# Patient Record
Sex: Female | Born: 1975 | Race: Black or African American | Hispanic: No | Marital: Single | Smoking: Never smoker
Health system: Southern US, Community
[De-identification: ages and names within clinical notes are randomized; demographics above are authoritative.]

## PROBLEM LIST (undated history)

## (undated) DIAGNOSIS — D259 Leiomyoma of uterus, unspecified: Secondary | ICD-10-CM

## (undated) DIAGNOSIS — R7303 Prediabetes: Secondary | ICD-10-CM

## (undated) DIAGNOSIS — G43909 Migraine, unspecified, not intractable, without status migrainosus: Secondary | ICD-10-CM

## (undated) DIAGNOSIS — E049 Nontoxic goiter, unspecified: Secondary | ICD-10-CM

## (undated) DIAGNOSIS — I10 Essential (primary) hypertension: Secondary | ICD-10-CM

## (undated) DIAGNOSIS — R87619 Unspecified abnormal cytological findings in specimens from cervix uteri: Secondary | ICD-10-CM

## (undated) DIAGNOSIS — E119 Type 2 diabetes mellitus without complications: Secondary | ICD-10-CM

## (undated) HISTORY — DX: Leiomyoma of uterus, unspecified: D25.9

## (undated) HISTORY — DX: Type 2 diabetes mellitus without complications: E11.9

## (undated) HISTORY — DX: Migraine, unspecified, not intractable, without status migrainosus: G43.909

## (undated) HISTORY — DX: Unspecified abnormal cytological findings in specimens from cervix uteri: R87.619

---

## 2003-06-16 HISTORY — PX: TUBAL LIGATION: SHX77

## 2006-09-27 ENCOUNTER — Emergency Department (HOSPITAL_COMMUNITY): Admission: EM | Admit: 2006-09-27 | Discharge: 2006-09-27 | Payer: Self-pay | Admitting: Emergency Medicine

## 2006-11-01 ENCOUNTER — Emergency Department (HOSPITAL_COMMUNITY): Admission: EM | Admit: 2006-11-01 | Discharge: 2006-11-01 | Payer: Self-pay | Admitting: Emergency Medicine

## 2006-11-03 ENCOUNTER — Emergency Department (HOSPITAL_COMMUNITY): Admission: EM | Admit: 2006-11-03 | Discharge: 2006-11-03 | Payer: Self-pay | Admitting: Emergency Medicine

## 2007-02-16 ENCOUNTER — Emergency Department (HOSPITAL_COMMUNITY): Admission: EM | Admit: 2007-02-16 | Discharge: 2007-02-16 | Payer: Self-pay | Admitting: Emergency Medicine

## 2007-11-25 ENCOUNTER — Emergency Department (HOSPITAL_COMMUNITY): Admission: EM | Admit: 2007-11-25 | Discharge: 2007-11-26 | Payer: Self-pay | Admitting: Emergency Medicine

## 2008-03-21 ENCOUNTER — Inpatient Hospital Stay (HOSPITAL_COMMUNITY): Admission: AD | Admit: 2008-03-21 | Discharge: 2008-03-21 | Payer: Self-pay | Admitting: Obstetrics and Gynecology

## 2008-12-22 HISTORY — PX: COLPOSCOPY: SHX161

## 2010-10-02 ENCOUNTER — Emergency Department (HOSPITAL_BASED_OUTPATIENT_CLINIC_OR_DEPARTMENT_OTHER)
Admission: EM | Admit: 2010-10-02 | Discharge: 2010-10-02 | Payer: Self-pay | Source: Home / Self Care | Admitting: Emergency Medicine

## 2011-07-12 LAB — URINALYSIS, ROUTINE W REFLEX MICROSCOPIC
Bilirubin Urine: NEGATIVE
Glucose, UA: NEGATIVE
Ketones, ur: 15 — AB
Leukocytes, UA: NEGATIVE
Protein, ur: 30 — AB
Urobilinogen, UA: 0.2

## 2011-07-12 LAB — DIFFERENTIAL
Basophils Relative: 0
Eosinophils Absolute: 0.4
Eosinophils Relative: 4
Monocytes Absolute: 0.7
Monocytes Relative: 7
Neutro Abs: 6.8
Neutrophils Relative %: 66

## 2011-07-12 LAB — CBC
HCT: 37.5
MCHC: 32.2
MCV: 83.8
RBC: 4.48
WBC: 10.7 — ABNORMAL HIGH

## 2011-07-12 LAB — WET PREP, GENITAL
Trich, Wet Prep: NONE SEEN
Yeast Wet Prep HPF POC: NONE SEEN

## 2011-07-12 LAB — POCT PREGNANCY, URINE
Operator id: 22373
Preg Test, Ur: NEGATIVE

## 2011-07-12 LAB — URINE MICROSCOPIC-ADD ON

## 2011-08-06 ENCOUNTER — Emergency Department (HOSPITAL_BASED_OUTPATIENT_CLINIC_OR_DEPARTMENT_OTHER)
Admission: EM | Admit: 2011-08-06 | Discharge: 2011-08-06 | Disposition: A | Discharge status: Home / Self Care | Payer: Managed Care, Other (non HMO) | Attending: Emergency Medicine | Admitting: Emergency Medicine

## 2011-08-06 ENCOUNTER — Encounter: Payer: Self-pay | Admitting: *Deleted

## 2011-08-06 DIAGNOSIS — N390 Urinary tract infection, site not specified: Secondary | ICD-10-CM

## 2011-08-06 DIAGNOSIS — Z79899 Other long term (current) drug therapy: Secondary | ICD-10-CM | POA: Insufficient documentation

## 2011-08-06 DIAGNOSIS — R109 Unspecified abdominal pain: Secondary | ICD-10-CM | POA: Insufficient documentation

## 2011-08-06 DIAGNOSIS — I10 Essential (primary) hypertension: Secondary | ICD-10-CM | POA: Insufficient documentation

## 2011-08-06 DIAGNOSIS — J45909 Unspecified asthma, uncomplicated: Secondary | ICD-10-CM | POA: Insufficient documentation

## 2011-08-06 HISTORY — DX: Essential (primary) hypertension: I10

## 2011-08-06 LAB — URINALYSIS, ROUTINE W REFLEX MICROSCOPIC
Bilirubin Urine: NEGATIVE
Glucose, UA: NEGATIVE mg/dL
Hgb urine dipstick: NEGATIVE
Ketones, ur: NEGATIVE mg/dL
Protein, ur: NEGATIVE mg/dL
Urobilinogen, UA: 1 mg/dL (ref 0.0–1.0)
pH: 7 (ref 5.0–8.0)

## 2011-08-06 LAB — URINE MICROSCOPIC-ADD ON

## 2011-08-06 MED ORDER — SULFAMETHOXAZOLE-TRIMETHOPRIM 800-160 MG PO TABS: 1.0000 | ORAL_TABLET | Freq: Two times a day (BID) | ORAL | Status: AC

## 2011-08-06 MED ORDER — TRAMADOL HCL 50 MG PO TABS: 50.0000 mg | ORAL_TABLET | Freq: Four times a day (QID) | ORAL | Status: AC | PRN

## 2011-08-06 NOTE — ED Provider Notes (Signed)
History     CSN: [id number] Arrival date & time: 08/06/2011  8:05 PM   First MD Initiated Contact with Patient 08/06/11 2120      Chief Complaint  Patient presents with  . Flank Pain    (Consider location/radiation/quality/duration/timing/severity/associated sxs/prior treatment) Patient is a 35 y.o. female presenting with flank pain. The history is provided by the patient.  Flank Pain The current episode started yesterday. The problem occurs constantly. The problem has been unchanged. Pertinent negatives include no abdominal pain, change in bowel habit or fever. Associated symptoms comments: She had nausea with vomiting one day prior to onset of right flank pain. No further nausea. . The symptoms are aggravated by twisting (Certain movements/positions.). She has tried nothing for the symptoms.    Past Medical History  Diagnosis Date  . Hypertension   . Asthma     Past Surgical History  Procedure Date  . Cesarean section     No family history on file.  History  Substance Use Topics  . Smoking status: Never Smoker   . Smokeless tobacco: Not on file  . Alcohol Use: Yes    OB History    Grav Para Term Preterm Abortions TAB SAB Ect Mult Living                  Review of Systems  Constitutional: Negative.  Negative for fever.  Respiratory: Negative.   Cardiovascular: Negative.   Gastrointestinal: Negative for abdominal pain and change in bowel habit.  Genitourinary: Positive for flank pain.  Musculoskeletal: Positive for back pain.    Allergies  Review of patient's allergies indicates no known allergies.  Home Medications   Current Outpatient Rx  Name Route Sig Dispense Refill  . ACETAMINOPHEN 500 MG PO TABS Oral Take 500 mg by mouth every 6 (six) hours as needed. For pain     . AZITHROMYCIN 250 MG PO TABS Oral Take 250 mg by mouth daily. Take 2 tabs on day 1 and take 1 tab on days 2-5     . FLUTICASONE PROPIONATE 50 MCG/ACT NA SUSP Nasal Place 2 sprays into  the nose daily.      Marland Kitchen FLUTICASONE-SALMETEROL 250-50 MCG/DOSE IN AEPB Inhalation Inhale 1 puff into the lungs every 12 (twelve) hours.      Marland Kitchen LISINOPRIL-HYDROCHLOROTHIAZIDE 20-12.5 MG PO TABS Oral Take 1 tablet by mouth daily.      . METHYLPREDNISOLONE 4 MG PO KIT Oral Take by mouth daily. follow package directions     . MONTELUKAST SODIUM 10 MG PO TABS Oral Take 10 mg by mouth daily.        BP 132/61  Pulse 70  Temp(Src) 98.3 F (36.8 C) (Oral)  Resp 15  Ht 5\' 6"  (1.676 m)  Wt 300 lb (136.079 kg)  BMI 48.42 kg/m2  SpO2 98%  LMP 07/16/2011  Physical Exam  Constitutional: She is oriented to person, place, and time. She appears well-developed and well-nourished.  Cardiovascular: Normal rate and regular rhythm.   Pulmonary/Chest: Effort normal and breath sounds normal.  Abdominal: Soft. There is no tenderness. There is no guarding.  Musculoskeletal:       Right flank nontender. Right lower back, paralumbar area (also pointed out as location of pain) is nontender to palpation.  Neurological: She is alert and oriented to person, place, and time. She has normal reflexes. Coordination normal.  Skin: Skin is warm and dry.    ED Course  Procedures (including critical care time)  Labs Reviewed  URINALYSIS, ROUTINE W REFLEX MICROSCOPIC - Abnormal; Notable for the following:    Appearance CLOUDY (*)    Leukocytes, UA LARGE (*)    All other components within normal limits  URINE MICROSCOPIC-ADD ON - Abnormal; Notable for the following:    Squamous Epithelial / LPF FEW (*)    Bacteria, UA MANY (*)    All other components within normal limits  PREGNANCY, URINE   No results found.   No diagnosis found.    MDM          Rodena Medin, PA 08/06/11 2215

## 2011-08-06 NOTE — ED Notes (Signed)
Pt c/o right flank pain that began yesterday. Pt denies urinary difficulty.

## 2011-08-07 NOTE — ED Provider Notes (Signed)
Medical screening examination/treatment/procedure(s) were performed by non-physician practitioner and as supervising physician I was immediately available for consultation/collaboration.   Zeven Kocak L Dejuan Elman, MD 08/07/11 0244 

## 2011-09-20 ENCOUNTER — Emergency Department (HOSPITAL_BASED_OUTPATIENT_CLINIC_OR_DEPARTMENT_OTHER)
Admission: EM | Admit: 2011-09-20 | Discharge: 2011-09-20 | Disposition: A | Discharge status: Home / Self Care | Payer: Managed Care, Other (non HMO) | Attending: Emergency Medicine | Admitting: Emergency Medicine

## 2011-09-20 ENCOUNTER — Encounter (HOSPITAL_BASED_OUTPATIENT_CLINIC_OR_DEPARTMENT_OTHER): Payer: Self-pay | Admitting: *Deleted

## 2011-09-20 DIAGNOSIS — J45909 Unspecified asthma, uncomplicated: Secondary | ICD-10-CM | POA: Insufficient documentation

## 2011-09-20 DIAGNOSIS — I10 Essential (primary) hypertension: Secondary | ICD-10-CM | POA: Insufficient documentation

## 2011-09-20 DIAGNOSIS — H11429 Conjunctival edema, unspecified eye: Secondary | ICD-10-CM | POA: Insufficient documentation

## 2011-09-20 MED ORDER — POLYMYXIN B-TRIMETHOPRIM 10000-0.1 UNIT/ML-% OP SOLN
2.0000 [drp] | OPHTHALMIC | Status: DC
  Administered 2011-09-20: 2 [drp] via OPHTHALMIC

## 2011-09-20 MED ORDER — KETOTIFEN FUMARATE 0.025 % OP SOLN: 1.0000 [drp] | Freq: Two times a day (BID) | OPHTHALMIC | Status: AC

## 2011-09-20 MED ORDER — BEPOTASTINE BESILATE 1.5 % OP SOLN: 1.0000 [drp] | Freq: Two times a day (BID) | OPHTHALMIC | Status: DC

## 2011-09-20 MED ORDER — POLYMYXIN B-TRIMETHOPRIM 10000-0.1 UNIT/ML-% OP SOLN
OPHTHALMIC | Status: AC
  Administered 2011-09-20: 2 [drp] via OPHTHALMIC
  Filled 2011-09-20: qty 10

## 2011-09-20 NOTE — ED Provider Notes (Signed)
History     CSN: [id number] Arrival date & time: 09/20/2011  9:07 PM   First MD Initiated Contact with Patient 09/20/11 2117      Chief Complaint  Patient presents with  . Eye Problem    (Consider location/radiation/quality/duration/timing/severity/associated sxs/prior treatment) HPI Patient complete as well he had a right. She states she has had nasal congestion and sinus pressure for the past several days and has been taking Ceftin for this x4 days. Tonight she noticed that the white portion of her eye was swollen. She denies any changes in her vision no pain in her eye. She has had some clear tear discharge from the eye but no thick discharge or crusting of her eyelids. She is had no redness or swelling of her eyelids. She's had no fever. She has had some purulent drainage from her nose over the past several days. She has had no systemic symptoms. She's had no history of anything similar to this in the past. No other associated systemic symptoms.  Past Medical History  Diagnosis Date  . Hypertension   . Asthma     Past Surgical History  Procedure Date  . Cesarean section     No family history on file.  History  Substance Use Topics  . Smoking status: Never Smoker   . Smokeless tobacco: Not on file  . Alcohol Use: Yes    OB History    Grav Para Term Preterm Abortions TAB SAB Ect Mult Living                  Review of Systems ROS reviewed and otherwise negative except for mentioned in HPI  Allergies  Review of patient's allergies indicates no known allergies.  Home Medications   Current Outpatient Rx  Name Route Sig Dispense Refill  . ALBUTEROL SULFATE HFA 108 (90 BASE) MCG/ACT IN AERS Inhalation Inhale 2 puffs into the lungs every 6 (six) hours as needed. For shortness of breath     . CLARITHROMYCIN 500 MG PO TABS Oral Take 500 mg by mouth 2 (two) times daily.      Marland Kitchen FLUTICASONE PROPIONATE 50 MCG/ACT NA SUSP Nasal Place 1 spray into the nose daily.     Marland Kitchen  FLUTICASONE-SALMETEROL 250-50 MCG/DOSE IN AEPB Inhalation Inhale 1 puff into the lungs every 12 (twelve) hours.      Marland Kitchen LISINOPRIL-HYDROCHLOROTHIAZIDE 20-12.5 MG PO TABS Oral Take 1 tablet by mouth daily.      Marland Kitchen MONTELUKAST SODIUM 10 MG PO TABS Oral Take 10 mg by mouth daily.      Marland Kitchen PROMETHAZINE-DM 6.25-15 MG/5ML PO SYRP Oral Take 10 mLs by mouth at bedtime as needed. For congestion     . PSEUDOEPHEDRINE HCL 30 MG PO TABS Oral Take 60 mg by mouth every 4 (four) hours as needed. For congestion     . TRAMADOL HCL 50 MG PO TABS Oral Take 50 mg by mouth every 6 (six) hours as needed. For pain. Maximum dose= 8 tablets per day     . BEPOTASTINE BESILATE 1.5 % OP SOLN Both Eyes Place 1 drop into both eyes 2 (two) times daily. 1 Bottle 0  . KETOTIFEN FUMARATE 0.025 % OP SOLN Right Eye Place 1 drop into the right eye 2 (two) times daily. 5 mL 0    BP 159/92  Pulse 94  Temp(Src) 98.8 F (37.1 C) (Oral)  Resp 20  SpO2 98% Vitals reviewed Physical Exam Physical Examination: General appearance - alert, well appearing, and in  no distress Mental status - alert, oriented to person, place, and time Eyes - pupils equal and reactive, extraocular eye movements intact, chemosis of inferior sclera, no eyelid swelling, no pain with EOM Ears - bilateral TM's and external ear canals normal Nose - normal and patent, erythema of nasal turbinates bilaterally Mouth - mucous membranes moist, pharynx normal without lesions Neck - supple, no significant adenopathy Chest - clear to auscultation, no wheezes, rales or rhonchi, symmetric air entry Heart - normal rate, regular rhythm, normal S1, S2, no murmurs, rubs, clicks or gallops Musculoskeletal - no joint tenderness, deformity or swelling Extremities - peripheral pulses normal, no pedal edema Skin - normal coloration and turgor, no rashes  ED Course  Procedures (including critical care time)  10:25 PM- slit lamp exam performed, normal with the exception of scleral  edema Paging opthalmology on call now.   10:41 PM d/w Dr. Delaney Meigs, opthalmology- he recommends starting polytrim gtts, also either zaditor or Bepreve drops for allergic conjunctivitis, cool compress, benadryl po, and he will see patient in the AM- to be at his office between 8-8:15am.    Labs Reviewed - No data to display No results found.   1. Chemosis of conjunctiva       MDM  Patient presents with onset of swelling of the sclera of her right eye, chemosis which began earlier tonight. She has no associated fever, changes in vision and no eye pain. Slit-lamp examination was normal with the exception of chemosis. Her case was discussed with ophthalmology who recommended the treatments as listed above. He will followup with the patient tomorrow morning. Patient was given Polytrim drops in the ED period she was discharged with strict return precautions and is agreeable with this plan.  Ethelda Chick, MD 09/20/11 [phone]

## 2011-09-20 NOTE — ED Notes (Signed)
Pt. Left eye is getting red and edema noted in the sclera

## 2011-09-20 NOTE — ED Notes (Signed)
Dr. Karma Ganja at bedside.  Pt. Is explaining that she has had a sinus infection with noted blowing her nose a lot but not this night.

## 2011-09-20 NOTE — ED Notes (Signed)
Slit lamp taken to Pt. Room and Dr. Karma Ganja to use at bedside.

## 2011-09-20 NOTE — ED Notes (Signed)
Pt. Is on meds for congestion and cough.  Pt. Is on Cefitan  And phenergan DM.  Pt. Was given these scripts by PCP on last Friday.

## 2011-09-20 NOTE — ED Notes (Signed)
Swelling to her right eye sclera. No pain.

## 2013-06-30 HISTORY — PX: ENDOMETRIAL BIOPSY: SHX622

## 2016-05-11 DIAGNOSIS — I10 Essential (primary) hypertension: Secondary | ICD-10-CM | POA: Diagnosis not present

## 2016-05-11 DIAGNOSIS — L509 Urticaria, unspecified: Secondary | ICD-10-CM | POA: Diagnosis not present

## 2016-05-11 DIAGNOSIS — T7840XA Allergy, unspecified, initial encounter: Secondary | ICD-10-CM | POA: Diagnosis present

## 2016-05-11 DIAGNOSIS — J45909 Unspecified asthma, uncomplicated: Secondary | ICD-10-CM | POA: Insufficient documentation

## 2016-05-11 DIAGNOSIS — Z79899 Other long term (current) drug therapy: Secondary | ICD-10-CM | POA: Insufficient documentation

## 2016-05-12 ENCOUNTER — Emergency Department (HOSPITAL_BASED_OUTPATIENT_CLINIC_OR_DEPARTMENT_OTHER)
Admission: EM | Admit: 2016-05-12 | Discharge: 2016-05-12 | Disposition: A | Discharge status: Home / Self Care | Payer: Managed Care, Other (non HMO) | Attending: Emergency Medicine | Admitting: Emergency Medicine

## 2016-05-12 ENCOUNTER — Encounter (HOSPITAL_BASED_OUTPATIENT_CLINIC_OR_DEPARTMENT_OTHER): Payer: Self-pay | Admitting: *Deleted

## 2016-05-12 DIAGNOSIS — T7840XA Allergy, unspecified, initial encounter: Secondary | ICD-10-CM

## 2016-05-12 DIAGNOSIS — L509 Urticaria, unspecified: Secondary | ICD-10-CM

## 2016-05-12 MED ORDER — EPINEPHRINE 0.3 MG/0.3ML IJ SOAJ: 0.3000 mg | Freq: Once | INTRAMUSCULAR | 0 refills | Status: AC | PRN

## 2016-05-12 MED ORDER — PREDNISONE 20 MG PO TABS
40.0000 mg | ORAL_TABLET | Freq: Once | ORAL | Status: AC
  Administered 2016-05-12: 40 mg via ORAL
  Filled 2016-05-12: qty 2

## 2016-05-12 MED ORDER — FAMOTIDINE 20 MG PO TABS
20.0000 mg | ORAL_TABLET | Freq: Once | ORAL | Status: AC
  Administered 2016-05-12: 20 mg via ORAL
  Filled 2016-05-12: qty 1

## 2016-05-12 MED ORDER — PREDNISONE 20 MG PO TABS: 60.0000 mg | ORAL_TABLET | Freq: Every day | ORAL | 0 refills | Status: DC

## 2016-05-12 MED ORDER — DIPHENHYDRAMINE HCL 25 MG PO CAPS
25.0000 mg | ORAL_CAPSULE | Freq: Once | ORAL | Status: AC
  Administered 2016-05-12: 25 mg via ORAL
  Filled 2016-05-12: qty 1

## 2016-05-12 NOTE — ED Provider Notes (Signed)
[place] [facility] Provider Note   CSN: [id number] Arrival date & time: 05/11/16  2333  First Provider Contact:  None       History   Chief Complaint Chief Complaint  Patient presents with  . Allergic Reaction    HPI Yvette Perry is a 40 y.o. female.  Patient with history of prediabetes, several months of frequent urticaria, presents with urticaria starting approximately 10 PM after eating at a restaurant. Patient denies any lip or tongue swelling, difficulty breathing, lightheadedness, syncope, nausea, vomiting, or diarrhea. Patient took 25mg  of Benadryl and applied hydrocortisone with some relief. She is uncertain as to what she is allergic to. She has a history of asthma which is controlled. No other allergic reactions or sensitivities. No new skin exposures. She was on Augmentin several weeks ago but tolerated this well. Symptoms gradually improving. Nothing makes symptoms worse.   The history is provided by the patient.  Allergic Reaction  Presenting symptoms: rash   Presenting symptoms: no difficulty swallowing and no wheezing     Past Medical History:  Diagnosis Date  . Asthma   . Hypertension     There are no active problems to display for this patient.   Past Surgical History:  Procedure Laterality Date  . CESAREAN SECTION      OB History    No data available       Home Medications    Prior to Admission medications   Medication Sig Start Date End Date Taking? Authorizing Provider  albuterol (PROVENTIL HFA;VENTOLIN HFA) 108 (90 BASE) MCG/ACT inhaler Inhale 2 puffs into the lungs every 6 (six) hours as needed. For shortness of breath     Historical Provider, MD  Bepotastine Besilate 1.5 % SOLN Place 1 drop into both eyes 2 (two) times daily. 09/20/11   Alfonzo Beers, MD  clarithromycin (BIAXIN) 500 MG tablet Take 500 mg by mouth 2 (two) times daily.      Historical Provider, MD  fluticasone (FLONASE) 50 MCG/ACT nasal spray Place 1 spray into the  nose daily.     Historical Provider, MD  Fluticasone-Salmeterol (ADVAIR) 250-50 MCG/DOSE AEPB Inhale 1 puff into the lungs every 12 (twelve) hours.      Historical Provider, MD  lisinopril-hydrochlorothiazide (PRINZIDE,ZESTORETIC) 20-12.5 MG per tablet Take 1 tablet by mouth daily.      Historical Provider, MD  montelukast (SINGULAIR) 10 MG tablet Take 10 mg by mouth daily.      Historical Provider, MD  promethazine-dextromethorphan (PROMETHAZINE-DM) 6.25-15 MG/5ML syrup Take 10 mLs by mouth at bedtime as needed. For congestion     Historical Provider, MD  pseudoephedrine (SUDAFED) 30 MG tablet Take 60 mg by mouth every 4 (four) hours as needed. For congestion     Historical Provider, MD  traMADol (ULTRAM) 50 MG tablet Take 50 mg by mouth every 6 (six) hours as needed. For pain. Maximum dose= 8 tablets per day     Historical Provider, MD    Family History No family history on file.  Social History Social History  Substance Use Topics  . Smoking status: Never Smoker  . Smokeless tobacco: Never Used  . Alcohol use Yes     Allergies   Review of patient's allergies indicates no known allergies.   Review of Systems Review of Systems  Constitutional: Negative for fever.  HENT: Negative for facial swelling and trouble swallowing.   Eyes: Negative for redness.  Respiratory: Negative for shortness of breath, wheezing and stridor.   Cardiovascular: Negative  for chest pain.  Gastrointestinal: Negative for nausea and vomiting.  Musculoskeletal: Negative for myalgias.  Skin: Positive for rash.  Neurological: Negative for light-headedness.  Psychiatric/Behavioral: Negative for confusion.     Physical Exam Updated Vital Signs BP 154/98 (BP Location: Left Arm)   Pulse 91   Temp 98.3 F (36.8 C) (Oral)   Resp 17   Ht 5\' 7"  (1.702 m)   Wt (!) 140.6 kg   LMP 04/17/2016   SpO2 98%   BMI 48.55 kg/m   Physical Exam  Constitutional: She appears well-developed and well-nourished.    HENT:  Head: Normocephalic and atraumatic.  Eyes: Conjunctivae are normal. Right eye exhibits no discharge. Left eye exhibits no discharge.  Neck: Normal range of motion. Neck supple.  Cardiovascular: Normal rate, regular rhythm and normal heart sounds.   Pulmonary/Chest: Effort normal and breath sounds normal.  Abdominal: Soft. There is no tenderness.  Neurological: She is alert.  Skin: Skin is warm and dry.  Scattered urticaria over her extremities, back, torso, face. Patient shows me a picture with more severe urticaria. Symptoms seem to be improving.  Psychiatric: She has a normal mood and affect.  Nursing note and vitals reviewed.    ED Treatments / Results  Labs (all labs ordered are listed, but only abnormal results are displayed) Labs Reviewed - No data to display  EKG  EKG Interpretation None       Radiology No results found.  Procedures Procedures (including critical care time)  Medications Ordered in ED Medications  predniSONE (DELTASONE) tablet 40 mg (not administered)  diphenhydrAMINE (BENADRYL) capsule 25 mg (not administered)  famotidine (PEPCID) tablet 20 mg (not administered)     Initial Impression / Assessment and Plan / ED Course  I have reviewed the triage vital signs and the nursing notes.  Pertinent labs & imaging results that were available during my care of the patient were reviewed by me and considered in my medical decision making (see chart for details).  Clinical Course  Comment By Time  Patient seen and examined. Work-up initiated. Medications ordered. No anaphylaxis.   Vital signs reviewed. Carlisle Cater, PA-C 07/29 0027   12:40 AM Handoff to Dr. Claudine Mouton. Plan: if stable at 1am, d/c to home with prednisone and antihistamines.   Final Clinical Impressions(s) / ED Diagnoses   Final diagnoses:  Urticaria   Pt with hives, no anaphylaxis. Likely food ingestion. Encouraged PCP f/u.   New Prescriptions New Prescriptions   No  medications on file     Carlisle Cater, PA-C 05/12/16 0041    Everlene Balls, MD 05/12/16 [phone]

## 2016-05-12 NOTE — ED Notes (Signed)
C/o whelps and itching after eating last pm,  Took 25 mg benadryl which has helped,  Denies throat swelling

## 2016-05-12 NOTE — ED Provider Notes (Signed)
Patient signed out as pending reassessment after mild allergic reaction. I reviewed the pictures on her cell phone from earlier tonight in her physical exam is vastly improved. Erythema on her arms is no longer present. She states the itching is resolved. We'll discharge her 4 more days of prednisone and also EpiPen to use as needed. She is advised to follow with her primary care physician so she can be referred to allergist. She demonstrates good understanding. She appears well and in no acute distress, vital signs remain within her normal limits and she is safe for discharge.   Everlene Balls, MD 05/12/16 0104

## 2016-05-12 NOTE — ED Triage Notes (Signed)
Pt. Has food allergies unknown and this has happened before.   Pt. Is not short of breath and has no tongue swelling or mouth swelling.

## 2018-09-07 ENCOUNTER — Inpatient Hospital Stay (HOSPITAL_COMMUNITY): Payer: Managed Care, Other (non HMO)

## 2018-09-07 ENCOUNTER — Encounter (HOSPITAL_COMMUNITY): Payer: Self-pay

## 2018-09-07 ENCOUNTER — Inpatient Hospital Stay (HOSPITAL_COMMUNITY)
Admission: AD | Admit: 2018-09-07 | Discharge: 2018-09-07 | Disposition: A | Discharge status: Home / Self Care | Payer: Managed Care, Other (non HMO) | Source: Ambulatory Visit | Attending: Obstetrics & Gynecology | Admitting: Obstetrics & Gynecology

## 2018-09-07 DIAGNOSIS — Y848 Other medical procedures as the cause of abnormal reaction of the patient, or of later complication, without mention of misadventure at the time of the procedure: Secondary | ICD-10-CM | POA: Insufficient documentation

## 2018-09-07 DIAGNOSIS — Z7952 Long term (current) use of systemic steroids: Secondary | ICD-10-CM | POA: Insufficient documentation

## 2018-09-07 DIAGNOSIS — I10 Essential (primary) hypertension: Secondary | ICD-10-CM | POA: Insufficient documentation

## 2018-09-07 DIAGNOSIS — J45909 Unspecified asthma, uncomplicated: Secondary | ICD-10-CM | POA: Diagnosis not present

## 2018-09-07 DIAGNOSIS — N939 Abnormal uterine and vaginal bleeding, unspecified: Secondary | ICD-10-CM

## 2018-09-07 DIAGNOSIS — Z79899 Other long term (current) drug therapy: Secondary | ICD-10-CM | POA: Diagnosis not present

## 2018-09-07 DIAGNOSIS — R102 Pelvic and perineal pain: Secondary | ICD-10-CM | POA: Diagnosis not present

## 2018-09-07 DIAGNOSIS — R7303 Prediabetes: Secondary | ICD-10-CM | POA: Diagnosis not present

## 2018-09-07 DIAGNOSIS — Z975 Presence of (intrauterine) contraceptive device: Secondary | ICD-10-CM

## 2018-09-07 DIAGNOSIS — T8332XA Displacement of intrauterine contraceptive device, initial encounter: Secondary | ICD-10-CM | POA: Diagnosis not present

## 2018-09-07 DIAGNOSIS — D259 Leiomyoma of uterus, unspecified: Secondary | ICD-10-CM | POA: Diagnosis not present

## 2018-09-07 HISTORY — DX: Prediabetes: R73.03

## 2018-09-07 HISTORY — DX: Nontoxic goiter, unspecified: E04.9

## 2018-09-07 LAB — URINALYSIS, ROUTINE W REFLEX MICROSCOPIC
BACTERIA UA: NONE SEEN
Bilirubin Urine: NEGATIVE
Glucose, UA: NEGATIVE mg/dL
KETONES UR: NEGATIVE mg/dL
Nitrite: NEGATIVE
Protein, ur: 30 mg/dL — AB
RBC / HPF: >50 RBC/hpf — ABNORMAL HIGH (ref 0–5)
Specific Gravity, Urine: 1.024 (ref 1.005–1.030)
pH: 5 (ref 5.0–8.0)

## 2018-09-07 LAB — CBC
HCT: 39.3 % (ref 36.0–46.0)
Hemoglobin: 12.2 g/dL (ref 12.0–15.0)
MCH: 26 pg (ref 26.0–34.0)
MCHC: 31 g/dL (ref 30.0–36.0)
MCV: 83.8 fL (ref 80.0–100.0)
NRBC: 0 % (ref 0.0–0.2)
Platelets: 377 10*3/uL (ref 150–400)
RBC: 4.69 MIL/uL (ref 3.87–5.11)
RDW: 15.1 % (ref 11.5–15.5)
WBC: 9.3 10*3/uL (ref 4.0–10.5)

## 2018-09-07 LAB — POCT PREGNANCY, URINE: PREG TEST UR: NEGATIVE

## 2018-09-07 MED ORDER — IBUPROFEN 800 MG PO TABS: 800.0000 mg | ORAL_TABLET | Freq: Three times a day (TID) | ORAL | 2 refills | Status: DC | PRN

## 2018-09-07 MED ORDER — MEGESTROL ACETATE 40 MG PO TABS
80.0000 mg | ORAL_TABLET | Freq: Once | ORAL | Status: AC
  Administered 2018-09-07: 80 mg via ORAL
  Filled 2018-09-07: qty 2

## 2018-09-07 MED ORDER — MEGESTROL ACETATE 40 MG PO TABS: 40.0000 mg | ORAL_TABLET | Freq: Two times a day (BID) | ORAL | 2 refills | Status: DC

## 2018-09-07 NOTE — MAU Provider Note (Signed)
Chief Complaint: Abdominal Pain and Vaginal Bleeding   First Provider Initiated Contact with Patient 09/07/18 1403     SUBJECTIVE HPI: Yvette Perry is a 42 y.o. G4P0 at  who presents to Maternity Admissions reporting moderate vaginal bleeding x 3 months w/ small clots. Has Hx of AUB abd known fibroids. Had transfusion 12/18 due to AUB. Mirena IUD placed soon afterwards. Controlled bleeding well until August 2019. Called Gyn about bleeding and her Gynecologist was no longer with the practice. Was not able to be seen for three weeks. Was Rx'd Aygestin 5 mg BID. Has been taking as directed x 7 days with no improvement.   Associated signs and symptoms: Pos for subjective tachycardia and mild dizziness--feels sort-of like when she had to have a transfusion last year.    Past Medical History:  Diagnosis Date  . Asthma   . Goiter   . Hypertension   . Pre-diabetes    OB History  Gravida Para Term Preterm AB Living  4            SAB TAB Ectopic Multiple Live Births        1      # Outcome Date GA Lbr Len/2nd Weight Sex Delivery Anes PTL Lv  4A Gravida      CS-LTranv     4B Gravida      CS-LTranv     3 Gravida      Vag-Spont     2 Gravida      Vag-Spont     1 Gravida      Vag-Spont      Past Surgical History:  Procedure Laterality Date  . CESAREAN SECTION     Social History   Socioeconomic History  . Marital status: Single    Spouse name: Not on file  . Number of children: Not on file  . Years of education: Not on file  . Highest education level: Not on file  Occupational History  . Not on file  Social Needs  . Financial resource strain: Not on file  . Food insecurity:    Worry: Not on file    Inability: Not on file  . Transportation needs:    Medical: Not on file    Non-medical: Not on file  Tobacco Use  . Smoking status: Never Smoker  . Smokeless tobacco: Never Used  Substance and Sexual Activity  . Alcohol use: Yes  . Drug use: No  . Sexual activity: Not Currently     Birth control/protection: IUD    Comment: tubal ligation  Lifestyle  . Physical activity:    Days per week: Not on file    Minutes per session: Not on file  . Stress: Not on file  Relationships  . Social connections:    Talks on phone: Not on file    Gets together: Not on file    Attends religious service: Not on file    Active member of club or organization: Not on file    Attends meetings of clubs or organizations: Not on file    Relationship status: Not on file  . Intimate partner violence:    Fear of current or ex partner: Not on file    Emotionally abused: Not on file    Physically abused: Not on file    Forced sexual activity: Not on file  Other Topics Concern  . Not on file  Social History Narrative  . Not on file   History reviewed. No pertinent family  history. No current facility-administered medications on file prior to encounter.    Current Outpatient Medications on File Prior to Encounter  Medication Sig Dispense Refill  . albuterol (PROVENTIL HFA;VENTOLIN HFA) 108 (90 BASE) MCG/ACT inhaler Inhale 2 puffs into the lungs every 6 (six) hours as needed. For shortness of breath     . Bepotastine Besilate 1.5 % SOLN Place 1 drop into both eyes 2 (two) times daily. 1 Bottle 0  . clarithromycin (BIAXIN) 500 MG tablet Take 500 mg by mouth 2 (two) times daily.      Marland Kitchen EPINEPHrine (EPIPEN 2-PAK) 0.3 mg/0.3 mL IJ SOAJ injection Inject 0.3 mLs (0.3 mg total) into the muscle once as needed (allergic reaction). 2 Device 0  . fluticasone (FLONASE) 50 MCG/ACT nasal spray Place 1 spray into the nose daily.     . Fluticasone-Salmeterol (ADVAIR) 250-50 MCG/DOSE AEPB Inhale 1 puff into the lungs every 12 (twelve) hours.      Marland Kitchen lisinopril-hydrochlorothiazide (PRINZIDE,ZESTORETIC) 20-12.5 MG per tablet Take 1 tablet by mouth daily.      . montelukast (SINGULAIR) 10 MG tablet Take 10 mg by mouth daily.      . predniSONE (DELTASONE) 20 MG tablet Take 3 tablets (60 mg total) by mouth  daily. 12 tablet 0  . promethazine-dextromethorphan (PROMETHAZINE-DM) 6.25-15 MG/5ML syrup Take 10 mLs by mouth at bedtime as needed. For congestion     . pseudoephedrine (SUDAFED) 30 MG tablet Take 60 mg by mouth every 4 (four) hours as needed. For congestion     . traMADol (ULTRAM) 50 MG tablet Take 50 mg by mouth every 6 (six) hours as needed. For pain. Maximum dose= 8 tablets per day      No Known Allergies  I have reviewed patient's Past Medical Hx, Surgical Hx, Family Hx, Social Hx, medications and allergies.   Review of Systems  Constitutional: Negative for chills and fever.  Cardiovascular:       Occasional sensation of tachycardia   Gastrointestinal: Positive for abdominal pain. Negative for constipation, diarrhea, nausea and vomiting.  Genitourinary: Positive for menstrual problem and vaginal bleeding. Negative for dysuria, flank pain, frequency, hematuria and vaginal discharge.  Neurological: Negative for dizziness.    OBJECTIVE Patient Vitals for the past 24 hrs:  BP Temp Temp src Pulse Resp SpO2 Weight  09/07/18 1757 (!) 142/75 - - 92 16 96 % -  09/07/18 1330 (!) 159/88 98.6 F (37 C) Oral 100 16 94 % (!) 139.3 kg   Constitutional: Well-developed, well-nourished, morbidly obese female in no acute distress. Skin: No Pallor  Cardiovascular: normal rate Respiratory: normal rate and effort.  GI: Abd soft, non-tender MS: Extremities nontender, no edema, normal ROM Neurologic: Alert and oriented x 4.  GU:  SPECULUM EXAM: NEFG, physiologic discharge, small amount of dark red blood noted, cervix clean, no polyps  BIMANUAL: cervix closed; UTA uterine size due to body habitus. Not obviously enlarged, no adnexal tenderness or masses. No CMT.  LAB RESULTS Results for orders placed or performed during the hospital encounter of 09/07/18 (from the past 24 hour(s))  CBC     Status: None   Collection Time: 09/07/18  1:46 PM  Result Value Ref Range   WBC 9.3 4.0 - 10.5 K/uL   RBC  4.69 3.87 - 5.11 MIL/uL   Hemoglobin 12.2 12.0 - 15.0 g/dL   HCT 39.3 36.0 - 46.0 %   MCV 83.8 80.0 - 100.0 fL   MCH 26.0 26.0 - 34.0 pg   MCHC 31.0  30.0 - 36.0 g/dL   RDW 15.1 11.5 - 15.5 %   Platelets 377 150 - 400 K/uL   nRBC 0.0 0.0 - 0.2 %  Urinalysis, Routine w reflex microscopic     Status: Abnormal   Collection Time: 09/07/18  1:46 PM  Result Value Ref Range   Color, Urine YELLOW YELLOW   APPearance HAZY (A) CLEAR   Specific Gravity, Urine 1.024 1.005 - 1.030   pH 5.0 5.0 - 8.0   Glucose, UA NEGATIVE NEGATIVE mg/dL   Hgb urine dipstick LARGE (A) NEGATIVE   Bilirubin Urine NEGATIVE NEGATIVE   Ketones, ur NEGATIVE NEGATIVE mg/dL   Protein, ur 30 (A) NEGATIVE mg/dL   Nitrite NEGATIVE NEGATIVE   Leukocytes, UA TRACE (A) NEGATIVE   RBC / HPF >50 (H) 0 - 5 RBC/hpf   Bacteria, UA NONE SEEN NONE SEEN   Squamous Epithelial / LPF 0-5 0 - 5   Mucus PRESENT   Pregnancy, urine POC     Status: None   Collection Time: 09/07/18  1:48 PM  Result Value Ref Range   Preg Test, Ur NEGATIVE NEGATIVE    IMAGING US Pelvis Transvanginal Non-ob (tv Only)  Result Date: 09/07/2018 CLINICAL DATA:  Lost IUD string.  Abnormal uterine bleeding.  Pain. EXAM: TRANSABDOMINAL AND TRANSVAGINAL ULTRASOUND OF PELVIS TECHNIQUE: Both transabdominal and transvaginal ultrasound examinations of the pelvis were performed. Transabdominal technique was performed for global imaging of the pelvis including uterus, ovaries, adnexal regions, and pelvic cul-de-sac. It was necessary to proceed with endovaginal exam following the transabdominal exam to visualize the endometrium and ovaries. COMPARISON:  None FINDINGS: Uterus Measurements: 9.1 x 6.1 x 6.5 cm.  A 13 mm fibroid is identified. Endometrium Thickness: 6 mm. No focal abnormality visualized. The patient's IUD is identified in the uterine fundus and body. I spoke directly to the sonographer to ensure that it was clearly visible on real-time imaging. Right ovary  Measurements: 4.5 x 2.9 x 3.6 cm = volume: 25 mL. Normal appearance/no adnexal mass. Left ovary Measurements: 2.4 x 1.6 x 1.9 cm = volume: 3.8 mL. Normal appearance/no adnexal mass. Other findings No abnormal free fluid. IMPRESSION: 1. The patient's IUD is identified. It is seen on provided images and was even more conspicuous on real-time imaging. 2. No cause for bleeding identified. The endometrial stripe thickness is normal for premenopausal woman. If bleeding remains unresponsive to hormonal or medical therapy, sonohysterogram should be considered for focal lesion work-up. (Ref: Radiological Reasoning: Algorithmic Workup of Abnormal Vaginal Bleeding with Endovaginal Sonography and Sonohysterography. AJR 2008; 962:X52-84) Electronically Signed   By: Dorise Bullion III M.D   On: 09/07/2018 17:14   US Pelvis (transabdominal Only)  Result Date: 09/07/2018 CLINICAL DATA:  Lost IUD string.  Abnormal uterine bleeding.  Pain. EXAM: TRANSABDOMINAL AND TRANSVAGINAL ULTRASOUND OF PELVIS TECHNIQUE: Both transabdominal and transvaginal ultrasound examinations of the pelvis were performed. Transabdominal technique was performed for global imaging of the pelvis including uterus, ovaries, adnexal regions, and pelvic cul-de-sac. It was necessary to proceed with endovaginal exam following the transabdominal exam to visualize the endometrium and ovaries. COMPARISON:  None FINDINGS: Uterus Measurements: 9.1 x 6.1 x 6.5 cm.  A 13 mm fibroid is identified. Endometrium Thickness: 6 mm. No focal abnormality visualized. The patient's IUD is identified in the uterine fundus and body. I spoke directly to the sonographer to ensure that it was clearly visible on real-time imaging. Right ovary Measurements: 4.5 x 2.9 x 3.6 cm = volume: 25 mL. Normal appearance/no  adnexal mass. Left ovary Measurements: 2.4 x 1.6 x 1.9 cm = volume: 3.8 mL. Normal appearance/no adnexal mass. Other findings No abnormal free fluid. IMPRESSION: 1. The  patient's IUD is identified. It is seen on provided images and was even more conspicuous on real-time imaging. 2. No cause for bleeding identified. The endometrial stripe thickness is normal for premenopausal woman. If bleeding remains unresponsive to hormonal or medical therapy, sonohysterogram should be considered for focal lesion work-up. (Ref: Radiological Reasoning: Algorithmic Workup of Abnormal Vaginal Bleeding with Endovaginal Sonography and Sonohysterography. AJR 2008; 539:J67-34) Electronically Signed   By: Dorise Bullion III M.D   On: 09/07/2018 17:14    MAU COURSE Orders Placed This Encounter  Procedures  . US PELVIS (TRANSABDOMINAL ONLY)  . US PELVIS TRANSVANGINAL NON-OB (TV ONLY)  . CBC  . Urinalysis, Routine w reflex microscopic  . Apply heat to affected area  . Pregnancy, urine POC  . Discharge patient   Meds ordered this encounter  Medications  . megestrol (MEGACE) tablet 80 mg  . megestrol (MEGACE) 40 MG tablet    Sig: Take 1 tablet (40 mg total) by mouth 2 (two) times daily. Take 1 tablet 3 times a day for 3 days then 1 tablet twice daily for 3 days then one tablet daily.  My increase to 2 tablets per day if bleeding increases.    Dispense:  60 tablet    Refill:  2    Order Specific Question:   Supervising Provider    Answer:   Elonda Husky, LUTHER H [[id number]]  . ibuprofen (ADVIL,MOTRIN) 800 MG tablet    Sig: Take 1 tablet (800 mg total) by mouth every 8 (eight) hours as needed for cramping.    Dispense:  30 tablet    Refill:  2    Order Specific Question:   Supervising Provider    Answer:   Florian Buff [[id number]]   Discussed Hx, labs, exam w/ Dr. Elonda Husky. Agrees w/ POC. New orders: D/C Aygestin and start Megace taper. May need Ablation.   MDM - AUB w/out anemia. Bleeding, VS stable. Mirena in correct position. Stable for OP Tx w/ Megace. Discussed possible need for ablation. If unable to get appt in a timely manner w/ current Gyn w/ F/U at [facility] practice. Lives in [place] so  offered Dr. Ihor Dow at [facility].   ASSESSMENT 1. IUD (intrauterine device) in place   2. Pelvic pain in female   3. Abnormal uterine bleeding (AUB)   4. IUD strings lost     PLAN Discharge home in stable condition. Bleeding precautions Follow-up Information    Dr Ouida Sills Follow up.   Why:  Call to schedule an appointment in the next 1 week to discuss long-term managament of your periods.        [facility] [place] Follow up.   Specialty:  Obstetrics and Gynecology Why:  Lavonia Drafts as needed for management of your abnormal bleeding Contact information: [address] [place] [address] [phone]         Allergies as of 09/07/2018   No Known Allergies     Medication List    TAKE these medications   albuterol 108 (90 Base) MCG/ACT inhaler Commonly known as:  PROVENTIL HFA;VENTOLIN HFA Inhale 2 puffs into the lungs every 6 (six) hours as needed. For shortness of breath   Bepotastine Besilate 1.5 % Soln Place 1 drop into both eyes 2 (two) times daily.  clarithromycin 500 MG tablet Commonly known as:  BIAXIN Take 500 mg by mouth 2 (two) times daily.   EPINEPHrine 0.3 mg/0.3 mL Soaj injection Commonly known as:  EPI-PEN Inject 0.3 mLs (0.3 mg total) into the muscle once as needed (allergic reaction).   fluticasone 50 MCG/ACT nasal spray Commonly known as:  FLONASE Place 1 spray into the nose daily.   Fluticasone-Salmeterol 250-50 MCG/DOSE Aepb Commonly known as:  ADVAIR Inhale 1 puff into the lungs every 12 (twelve) hours.   ibuprofen 800 MG tablet Commonly known as:  ADVIL,MOTRIN Take 1 tablet (800 mg total) by mouth every 8 (eight) hours as needed for cramping.   lisinopril-hydrochlorothiazide 20-12.5 MG tablet Commonly known as:  PRINZIDE,ZESTORETIC Take 1 tablet by mouth daily.   megestrol 40 MG tablet Commonly known as:  MEGACE Take 1 tablet (40 mg  total) by mouth 2 (two) times daily. Take 1 tablet 3 times a day for 3 days then 1 tablet twice daily for 3 days then one tablet daily.  My increase to 2 tablets per day if bleeding increases.   montelukast 10 MG tablet Commonly known as:  SINGULAIR Take 10 mg by mouth daily.   predniSONE 20 MG tablet Commonly known as:  DELTASONE Take 3 tablets (60 mg total) by mouth daily.   promethazine-dextromethorphan 6.25-15 MG/5ML syrup Commonly known as:  PROMETHAZINE-DM Take 10 mLs by mouth at bedtime as needed. For congestion   pseudoephedrine 30 MG tablet Commonly known as:  SUDAFED Take 60 mg by mouth every 4 (four) hours as needed. For congestion   traMADol 50 MG tablet Commonly known as:  ULTRAM Take 50 mg by mouth every 6 (six) hours as needed. For pain. Maximum dose= 8 tablets per day        Tamala Julian [place], [place] 09/07/2018  6:28 PM

## 2018-09-07 NOTE — Discharge Instructions (Signed)
Discussed management options for menorrhagia and fibroids including oral progesterones, Mirena IUD, endometrial ablation (Novasure/Hydrothermal Ablation/Thermachoice), myomectomy or hysterectomy as definitive surgical management. Discussed risks and benefits of each method.   Endometrial Ablation Endometrial ablation is a procedure that destroys the thin inner layer of the lining of the uterus (endometrium). This procedure may be done:  To stop heavy periods.  To stop bleeding that is causing anemia.  To control irregular bleeding.  To treat bleeding caused by small tumors (fibroids) in the endometrium.  This procedure is often an alternative to major surgery, such as removal of the uterus and cervix (hysterectomy). As a result of this procedure:  You may not be able to have children. However, if you are premenopausal (you have not gone through menopause): ? You may still have a small chance of getting pregnant. ? You will need to use a reliable method of birth control after the procedure to prevent pregnancy.  You may stop having a menstrual period, or you may have only a small amount of bleeding during your period. Menstruation may return several years after the procedure.  Tell a health care provider about:  Any allergies you have.  All medicines you are taking, including vitamins, herbs, eye drops, creams, and over-the-counter medicines.  Any problems you or family members have had with the use of anesthetic medicines.  Any blood disorders you have.  Any surgeries you have had.  Any medical conditions you have. What are the risks? Generally, this is a safe procedure. However, problems may occur, including:  A hole (perforation) in the uterus or bowel.  Infection of the uterus, bladder, or vagina.  Bleeding.  Damage to other structures or organs.  An air bubble in the lung (air embolus).  Problems with pregnancy after the procedure.  Failure of the  procedure.  Decreased ability to diagnose cancer in the endometrium.  What happens before the procedure?  You will have tests of your endometrium to make sure there are no pre-cancerous cells or cancer cells present.  You may have an ultrasound of the uterus.  You may be given medicines to thin the endometrium.  Ask your health care provider about: ? Changing or stopping your regular medicines. This is especially important if you take diabetes medicines or blood thinners. ? Taking medicines such as aspirin and ibuprofen. These medicines can thin your blood. Do not take these medicines before your procedure if your doctor tells you not to.  Plan to have someone take you home from the hospital or clinic. What happens during the procedure?  You will lie on an exam table with your feet and legs supported as in a pelvic exam.  To lower your risk of infection: ? Your health care team will wash or sanitize their hands and put on germ-free (sterile) gloves. ? Your genital area will be washed with soap.  An IV tube will be inserted into one of your veins.  You will be given a medicine to help you relax (sedative).  A surgical instrument with a light and camera (resectoscope) will be inserted into your vagina and moved into your uterus. This allows your surgeon to see inside your uterus.  Endometrial tissue will be removed using one of the following methods: ? Radiofrequency. This method uses a radiofrequency-alternating electric current to remove the endometrium. ? Cryotherapy. This method uses extreme cold to freeze the endometrium. ? Heated-free liquid. This method uses a heated saltwater (saline) solution to remove the endometrium. ? Microwave. This  method uses high-energy microwaves to heat up the endometrium and remove it. ? Thermal balloon. This method involves inserting a catheter with a balloon tip into the uterus. The balloon tip is filled with heated fluid to remove the  endometrium. The procedure may vary among health care providers and hospitals. What happens after the procedure?  Your blood pressure, heart rate, breathing rate, and blood oxygen level will be monitored until the medicines you were given have worn off.  As tissue healing occurs, you may notice vaginal bleeding for 4-6 weeks after the procedure. You may also experience: ? Cramps. ? Thin, watery vaginal discharge that is light pink or brown in color. ? A need to urinate more frequently than usual. ? Nausea.  Do not drive for 24 hours if you were given a sedative.  Do not have sex or insert anything into your vagina until your health care provider approves. Summary  Endometrial ablation is done to treat the many causes of heavy menstrual bleeding.  The procedure may be done only after medications have been tried to control the bleeding.  Plan to have someone take you home from the hospital or clinic. This information is not intended to replace advice given to you by your health care provider. Make sure you discuss any questions you have with your health care provider. Document Released: 08/10/2004 Document Revised: 10/18/2016 Document Reviewed: 10/18/2016 Elsevier Interactive Patient Education  2017 [employer].

## 2018-09-07 NOTE — MAU Note (Signed)
Got IUD last year. States she has been bleeding daily for a couple months  Was Rx medicine for the bleeding but it hasn't helped, states it slows the bleeding but doesn't stop it  Feels weak and tired  Saw clots this morning, wearing 2 pads at a time and has changed that 3 times  Feelings lots of abdominal pressure

## 2018-10-24 ENCOUNTER — Encounter: Payer: Self-pay | Admitting: Obstetrics & Gynecology

## 2018-10-24 ENCOUNTER — Ambulatory Visit (INDEPENDENT_AMBULATORY_CARE_PROVIDER_SITE_OTHER): Payer: Managed Care, Other (non HMO) | Admitting: Obstetrics & Gynecology

## 2018-10-24 VITALS — BP 130/77 | HR 91 | Ht 66.0 in | Wt 306.1 lb

## 2018-10-24 DIAGNOSIS — Z1151 Encounter for screening for human papillomavirus (HPV): Secondary | ICD-10-CM

## 2018-10-24 DIAGNOSIS — Z1239 Encounter for other screening for malignant neoplasm of breast: Secondary | ICD-10-CM

## 2018-10-24 DIAGNOSIS — N898 Other specified noninflammatory disorders of vagina: Secondary | ICD-10-CM | POA: Diagnosis not present

## 2018-10-24 DIAGNOSIS — N760 Acute vaginitis: Secondary | ICD-10-CM

## 2018-10-24 DIAGNOSIS — Z124 Encounter for screening for malignant neoplasm of cervix: Secondary | ICD-10-CM | POA: Diagnosis not present

## 2018-10-24 DIAGNOSIS — B9689 Other specified bacterial agents as the cause of diseases classified elsewhere: Secondary | ICD-10-CM | POA: Diagnosis not present

## 2018-10-24 DIAGNOSIS — Z01419 Encounter for gynecological examination (general) (routine) without abnormal findings: Secondary | ICD-10-CM

## 2018-10-24 DIAGNOSIS — N939 Abnormal uterine and vaginal bleeding, unspecified: Secondary | ICD-10-CM | POA: Diagnosis not present

## 2018-10-24 MED ORDER — MEGESTROL ACETATE 40 MG PO TABS: 40.0000 mg | ORAL_TABLET | Freq: Every day | ORAL | 3 refills | Status: DC

## 2018-10-24 NOTE — Progress Notes (Signed)
Subjective:     Yvette Perry is a 43 y.o. female here for a routine exam.  [id number] LMP 08/2018 She had 2 months of bleeding before that.  Current complaints: h/o AUB s/p hospitalization last year and had 3 units of blood. She was seen my Dr. Aurea Graff who placed a LnIUD 10/14/2017. She reports cont'd bleeding despite he LnIUD. She has been on Norethindrone with no relief of sx. Pt was seen at [facility] 09/07/2018 and was placed on Megace. Her bleeding improved with the Megace.  Pt had several endometrial bx and all were neg. Pt was last seen by that doc in 12/2017.    Gynecologic History No LMP recorded. (Menstrual status: IUD). Contraception: LnIUD / BTL Last Pap: 10/2017. Results were: normal Last mammogram: never had.   Obstetric History OB History  Gravida Para Term Preterm AB Living  4 4 3 1   4   SAB TAB Ectopic Multiple Live Births        1 4    # Outcome Date GA Lbr Len/2nd Weight Sex Delivery Anes PTL Lv  4A Preterm 07/12/03 [redacted]w[redacted]d   M CS-LTranv Spinal  LIV  4B Preterm 07/12/03 [redacted]w[redacted]d   M CS-LTranv Spinal    3 Term 2002 [redacted]w[redacted]d   F Vag-Spont None  LIV  2 Term 1998 [redacted]w[redacted]d   M Vag-Spont   LIV  1 Term 5 [redacted]w[redacted]d   F Vag-Spont   LIV   The following portions of the patient's history were reviewed and updated as appropriate: allergies, current medications, past family history, past medical history, past social history, past surgical history and problem list.  Review of Systems Pertinent items are noted in HPI.    Objective:  BP 130/77   Pulse 91   Ht 5\' 6"  (1.676 m)   Wt (!) 306 lb 1.9 oz (138.9 kg)   BMI 49.41 kg/m  General Appearance:    Alert, cooperative, no distress, appears stated age  Head:    Normocephalic, without obvious abnormality, atraumatic  Eyes:    conjunctiva/corneas clear, EOM's intact, both eyes  Ears:    Normal external ear canals, both ears  Nose:   Nares normal, septum midline, mucosa normal, no drainage    or sinus tenderness  Throat:   Lips, mucosa, and  tongue normal; teeth and gums normal  Neck:   Supple, symmetrical, trachea midline, no adenopathy;    thyroid:  no enlargement/tenderness/nodules  Back:     Symmetric, no curvature, ROM normal, no CVA tenderness  Lungs:     Clear to auscultation bilaterally, respirations unlabored  Chest Wall:    No tenderness or deformity   Heart:    Regular rate and rhythm, S1 and S2 normal, no murmur, rub   or gallop  Breast Exam:    No tenderness, masses, or nipple abnormality  Abdomen:     Soft, non-tender, bowel sounds active all four quadrants,    no masses, no organomegaly; obese  Genitalia:    Normal female without lesion, discharge or tenderness; no cervical lesions; uterus mobile      Extremities:   Extremities normal, atraumatic, no cyanosis or edema  Pulses:   2+ and symmetric all extremities  Skin:   Skin color, texture, turgor normal, no rashes or lesions     09/07/2018 CLINICAL DATA:  Lost IUD string.  Abnormal uterine bleeding.  Pain.  EXAM: TRANSABDOMINAL AND TRANSVAGINAL ULTRASOUND OF PELVIS  TECHNIQUE: Both transabdominal and transvaginal ultrasound examinations of the pelvis were  performed. Transabdominal technique was performed for global imaging of the pelvis including uterus, ovaries, adnexal regions, and pelvic cul-de-sac. It was necessary to proceed with endovaginal exam following the transabdominal exam to visualize the endometrium and ovaries.  COMPARISON:  None  FINDINGS: Uterus  Measurements: 9.1 x 6.1 x 6.5 cm.  A 13 mm fibroid is identified.  Endometrium  Thickness: 6 mm. No focal abnormality visualized. The patient's IUD is identified in the uterine fundus and body. I spoke directly to the sonographer to ensure that it was clearly visible on real-time imaging.  Right ovary  Measurements: 4.5 x 2.9 x 3.6 cm = volume: 25 mL. Normal appearance/no adnexal mass.  Left ovary  Measurements: 2.4 x 1.6 x 1.9 cm = volume: 3.8 mL.  Normal appearance/no adnexal mass.  Other findings  No abnormal free fluid.  IMPRESSION: 1. The patient's IUD is identified. It is seen on provided images and was even more conspicuous on real-time imaging. 2. No cause for bleeding identified. The endometrial stripe thickness is normal for premenopausal woman. If bleeding remains unresponsive to hormonal or medical therapy, sonohysterogram should be considered for focal lesion work-up. (Ref: Radiological Reasoning: Algorithmic Workup of Abnormal Vaginal Bleeding with Endovaginal Sonography and Sonohysterography. AJR 2008; [id number]:N82-95)  Assessment:    Healthy female exam.   AUB improved with Megace- per pt has had endo bx within the year. Need records  Breast cancer screen   Plan:    Mammogram ordered.    F/u PAP Megace 40mg  daily  Records from prev GYN ofc.  Walking daily 6 days/ week  Delisha Peaden L. Harraway-Smith, M.D., Cherlynn June

## 2018-10-24 NOTE — Patient Instructions (Signed)
WALKING  Walking is a great form of exercise to increase your strength, endurance and overall fitness.  A walking program can help you start slowly and gradually build endurance as you go.  Everyone's ability is different, so each person's starting point will be different.  You do not have to follow them exactly.  The are just samples. You should simply find out what's right for you and stick to that program.   In the beginning, you'll start off walking 2-3 times a day for short distances.  As you get stronger, you'll be walking further at just 1-2 times per day.  A. You Can Walk For A Certain Length Of Time Each Day    Walk 5 minutes 3 times per day.  Increase 2 minutes every 2 days (3 times per day).  Work up to 25-30 minutes (1-2 times per day).   Example:   Day 1-2 5 minutes 3 times per day   Day 7-8 12 minutes 2-3 times per day   Day 13-14 25 minutes 1-2 times per day  B. You Can Walk For a Certain Distance Each Day     Distance can be substituted for time.    Example:   3 trips to mailbox (at road)   3 trips to corner of block   3 trips around the block  C. Go to local [employer] and use the track.    Walk for distance 1/2 mile to start around track  Or time 30 minutes  D. Increase to 45 min - 1 hour daily!

## 2018-10-25 ENCOUNTER — Ambulatory Visit (HOSPITAL_BASED_OUTPATIENT_CLINIC_OR_DEPARTMENT_OTHER): Payer: Managed Care, Other (non HMO)

## 2018-10-28 LAB — CYTOLOGY - PAP
Bacterial vaginitis: POSITIVE — AB
Candida vaginitis: NEGATIVE
DIAGNOSIS: NEGATIVE
HPV: NOT DETECTED

## 2018-10-30 ENCOUNTER — Telehealth: Payer: Self-pay

## 2018-10-30 ENCOUNTER — Other Ambulatory Visit: Payer: Self-pay | Admitting: Obstetrics & Gynecology

## 2018-10-30 MED ORDER — METRONIDAZOLE 500 MG PO TABS: 500.0000 mg | ORAL_TABLET | Freq: Two times a day (BID) | ORAL | 0 refills | Status: DC

## 2018-10-30 NOTE — Telephone Encounter (Signed)
Called patient and unable to leave message. Kathrene Alu RN

## 2018-10-30 NOTE — Telephone Encounter (Signed)
-----   Message from Lavonia Drafts, MD sent at 10/30/2018 11:49 AM EST ----- Please call pt. She has BV. Rx sent to pharmacy.  Thx, clh-S

## 2018-10-30 NOTE — Addendum Note (Signed)
Addended by: Lavonia Drafts on: 10/30/2018 11:49 AM   Modules accepted: Orders

## 2018-10-31 NOTE — Telephone Encounter (Signed)
Called again and unable to leave message. Kathrene Alu RN

## 2019-06-08 IMAGING — US US TRANSVAGINAL NON-OB
2 series · 15 of 25 positions shown · non-contrast
Comparison: None

CLINICAL DATA: Lost IUD string.  Abnormal uterine bleeding.  Pain.

EXAM:
TRANSABDOMINAL AND TRANSVAGINAL ULTRASOUND OF PELVIS
TECHNIQUE: Both transabdominal and transvaginal ultrasound examinations of the
pelvis were performed. Transabdominal technique was performed for
global imaging of the pelvis including uterus, ovaries, adnexal
regions, and pelvic cul-de-sac. It was necessary to proceed with
endovaginal exam following the transabdominal exam to visualize the
endometrium and ovaries.

[Series 1: us transvaginal non-ob · 7 of 37 slices shown (1 of 2)]
[im 1/37]
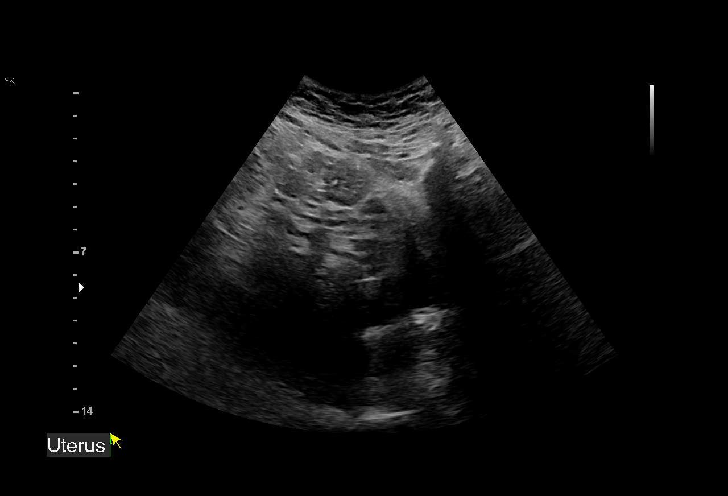
[im 7/37]
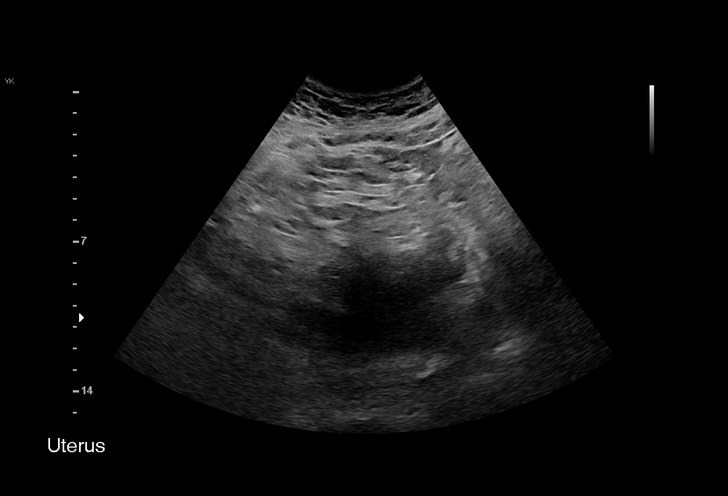
[im 14/37]
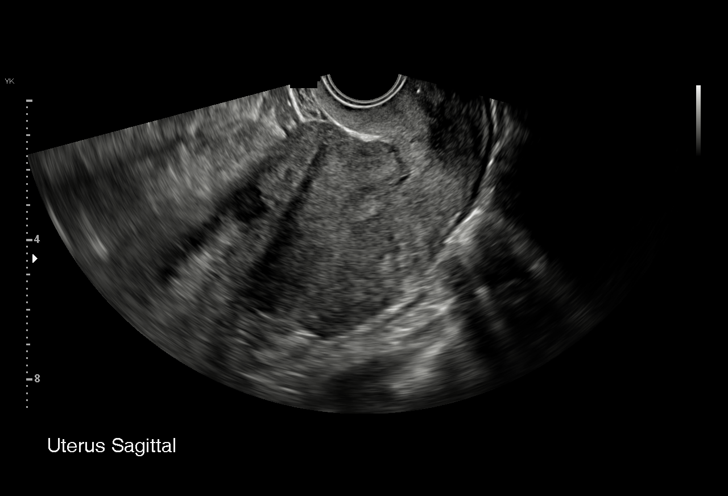
[im 17/37]
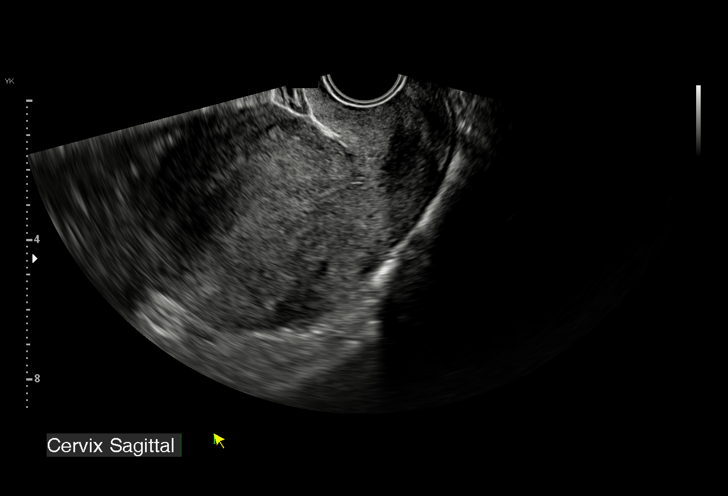
[im 23/37]
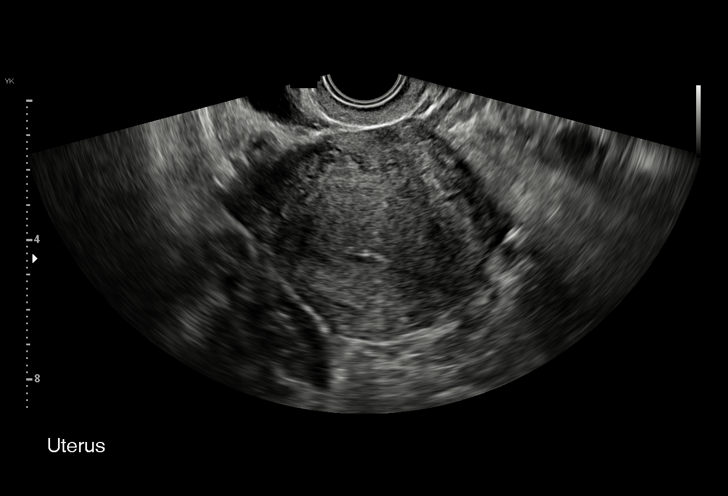
[im 30/37]
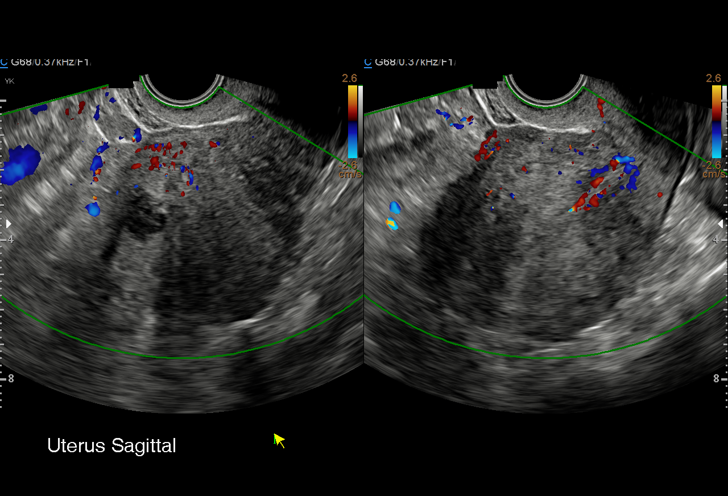
[im 33/37]
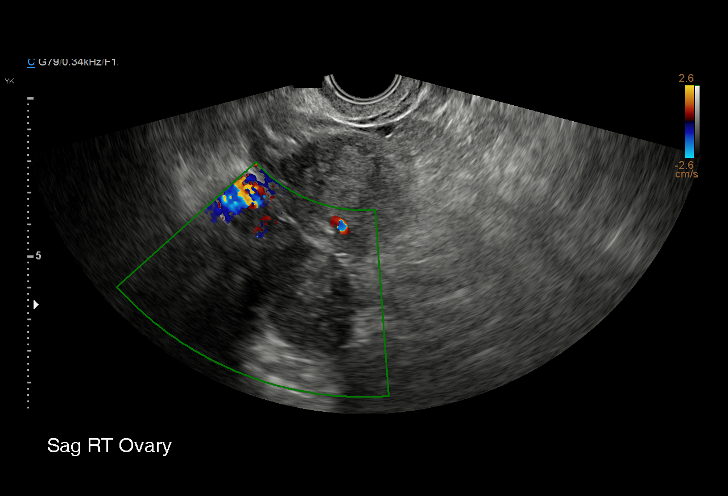

[Series 3: us transvaginal non-ob · 38 acquisitions, 8 frames shown (2 of 2)]
[im 1/38]
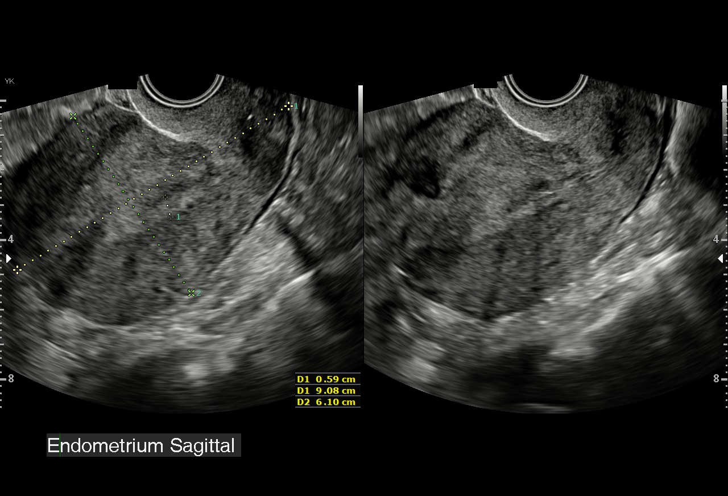
[im 7/38]
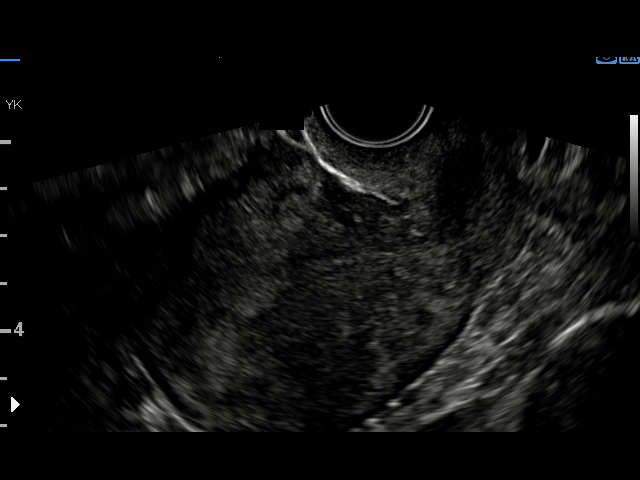
[im 10/38]
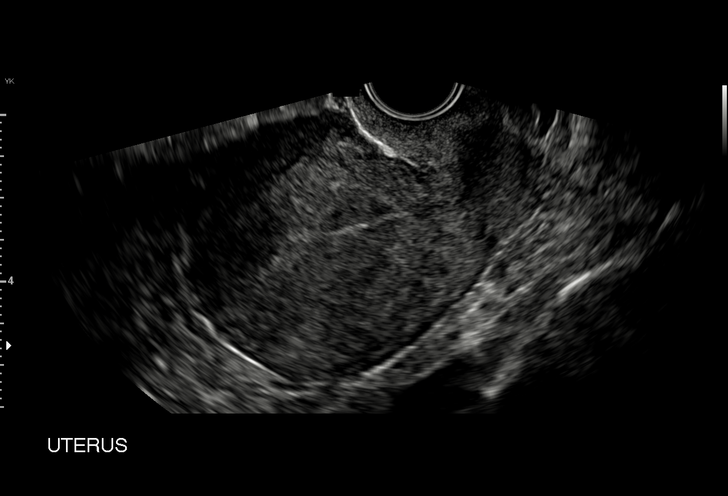
[im 16/38]
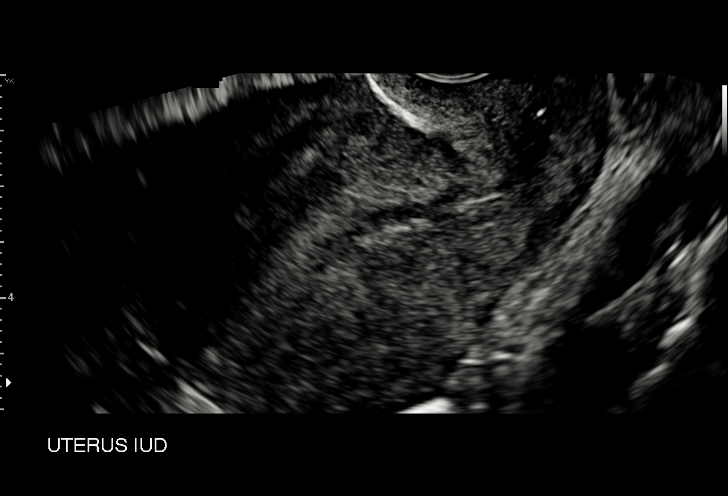
[im 22/38]
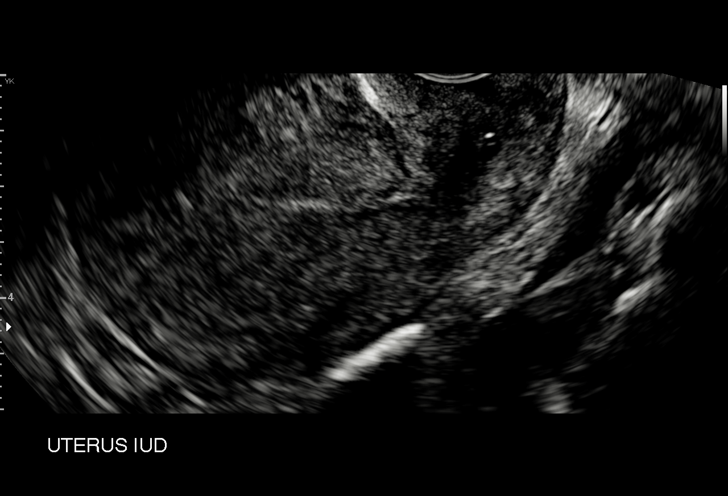
[im 25/38]
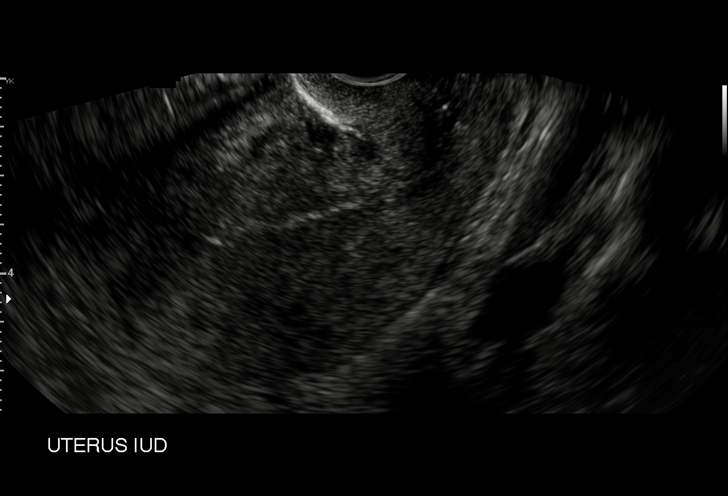
[im 31/38]
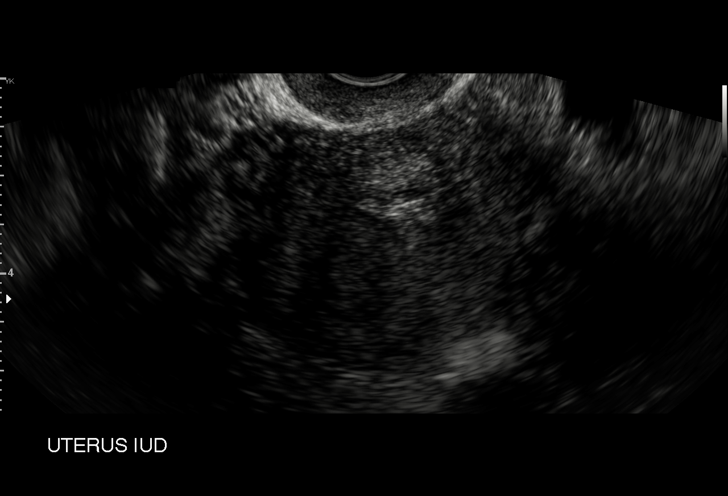
[im 38/38]
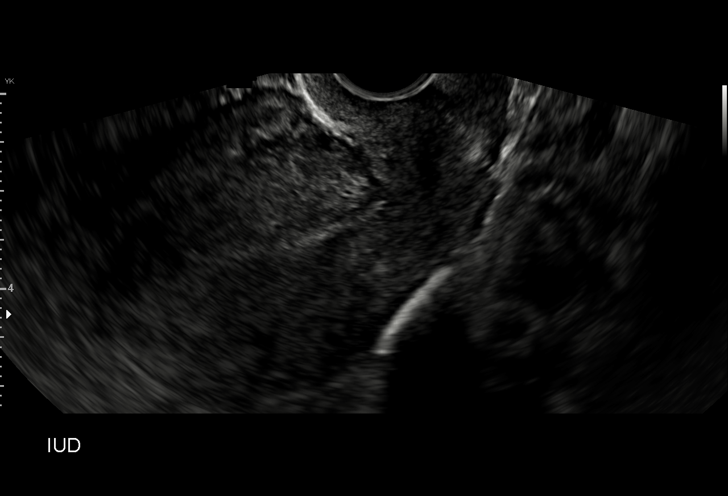

[15 of 25 positions shown; findings below may reference images not displayed]

FINDINGS: Uterus

Measurements: 9.1 x 6.1 x 6.5 cm.  A 13 mm fibroid is identified.

Endometrium

Thickness: 6 mm. No focal abnormality visualized. The patient's IUD
is identified in the uterine fundus and body. I spoke directly to
the sonographer to ensure that it was clearly visible on real-time
imaging.

Right ovary

Measurements: 4.5 x 2.9 x 3.6 cm = volume: 25 mL. Normal
appearance/no adnexal mass.

Left ovary

Measurements: 2.4 x 1.6 x 1.9 cm = volume: 3.8 mL. Normal
appearance/no adnexal mass.

Other findings

No abnormal free fluid.
IMPRESSION: 1. The patient's IUD is identified. It is seen on provided images
and was even more conspicuous on real-time imaging.
2. No cause for bleeding identified. The endometrial stripe
thickness is normal for premenopausal woman. If bleeding remains
unresponsive to hormonal or medical therapy, sonohysterogram should
be considered for focal lesion work-up. (Ref: Radiological
Reasoning: Algorithmic Workup of Abnormal Vaginal Bleeding with
Endovaginal Sonography and Sonohysterography. AJR 6008; 191:S68-73)

## 2019-06-25 ENCOUNTER — Other Ambulatory Visit: Payer: Self-pay

## 2019-06-25 DIAGNOSIS — N939 Abnormal uterine and vaginal bleeding, unspecified: Secondary | ICD-10-CM

## 2019-06-25 MED ORDER — MEGESTROL ACETATE 40 MG PO TABS: 40.0000 mg | ORAL_TABLET | Freq: Every day | ORAL | 3 refills | Status: DC

## 2020-05-23 ENCOUNTER — Other Ambulatory Visit: Payer: Self-pay

## 2020-05-23 ENCOUNTER — Encounter: Payer: Self-pay | Admitting: Obstetrics & Gynecology

## 2020-05-23 ENCOUNTER — Other Ambulatory Visit (HOSPITAL_COMMUNITY)
Admission: RE | Admit: 2020-05-23 | Discharge: 2020-05-23 | Disposition: A | Discharge status: Home / Self Care | Payer: Managed Care, Other (non HMO) | Source: Ambulatory Visit | Attending: Obstetrics & Gynecology | Admitting: Obstetrics & Gynecology

## 2020-05-23 ENCOUNTER — Ambulatory Visit (INDEPENDENT_AMBULATORY_CARE_PROVIDER_SITE_OTHER): Payer: Managed Care, Other (non HMO) | Admitting: Obstetrics & Gynecology

## 2020-05-23 VITALS — BP 118/65 | HR 79 | Ht 66.0 in | Wt 285.0 lb

## 2020-05-23 DIAGNOSIS — N939 Abnormal uterine and vaginal bleeding, unspecified: Secondary | ICD-10-CM

## 2020-05-23 DIAGNOSIS — Z1239 Encounter for other screening for malignant neoplasm of breast: Secondary | ICD-10-CM

## 2020-05-23 DIAGNOSIS — Z01419 Encounter for gynecological examination (general) (routine) without abnormal findings: Secondary | ICD-10-CM | POA: Diagnosis not present

## 2020-05-23 MED ORDER — MEGESTROL ACETATE 40 MG PO TABS: 40.0000 mg | ORAL_TABLET | Freq: Two times a day (BID) | ORAL | 3 refills | Status: DC

## 2020-05-23 NOTE — Progress Notes (Signed)
Subjective:     Yvette Perry is a 44 y.o. female here for a routine exam. [phone] (s/p SVD x1 and c/s x1 for twin IUP)  Current complaints: pt reports continued bleeding daily.  She reports that she is still taking the Megace and had the IUD. She also has a known goiter. She was prev seen by ENT and surgery was recommended but, her son was having surgery and she wanted to make sure to take care of him first.  Her labs were done by her PCP and she reports that the TSH was WNL.         Gynecologic History No LMP recorded. (Menstrual status: IUD). Contraception: IUD Last Pap: 10/24/2018. Results were: normal Last mammogram: 07/2019 at [facility] mobile [facility]. (pt works at [employer]).   Obstetric History OB History  Gravida Para Term Preterm AB Living  4 4 3 1   4   SAB TAB Ectopic Multiple Live Births        1 4    # Outcome Date GA Lbr Len/2nd Weight Sex Delivery Anes PTL Lv  4A Preterm 07/12/03 [redacted]w[redacted]d   M CS-LTranv Spinal  LIV  4B Preterm 07/12/03 [redacted]w[redacted]d   M CS-LTranv Spinal    3 Term 2002 [redacted]w[redacted]d   F Vag-Spont None  LIV  2 Term 1998 [redacted]w[redacted]d   M Vag-Spont   LIV  1 Term 71 [redacted]w[redacted]d   F Vag-Spont   LIV   The following portions of the patient's history were reviewed and updated as appropriate: allergies, current medications, past family history, past medical history, past social history, past surgical history and problem list.  Review of Systems Pertinent items are noted in HPI.    Objective:  BP 118/65   Pulse 79   Ht 5\' 6"  (1.676 m)   Wt 285 lb (129.3 kg)   BMI 46.00 kg/m  General Appearance:    Alert, cooperative, no distress, appears stated age  Head:    Normocephalic, without obvious abnormality, atraumatic  Eyes:    conjunctiva/corneas clear, EOM's intact, both eyes  Ears:    Normal external ear canals, both ears  Nose:   Nares normal, septum midline, mucosa normal, no drainage    or sinus tenderness  Throat:   Lips, mucosa, and tongue normal; teeth and gums normal  Neck:    Supple, symmetrical, trachea midline, no adenopathy;    thyroid:  no enlargement/tenderness/nodules  Back:     Symmetric, no curvature, ROM normal, no CVA tenderness  Lungs:     respirations unlabored  Chest Wall:    No tenderness or deformity   Heart:    Regular rate and rhythm  Breast Exam:    No tenderness, masses, or nipple abnormality  Abdomen:     Soft, non-tender, bowel sounds active all four quadrants,    no masses, no organomegaly  Genitalia:    Normal female without lesion, discharge or tenderness   IUD strings not visualized. Full size of uterus not appreciated due to body habitus. There is good descensus and the uterus is mobile.   Extremities:   Extremities normal, atraumatic, no cyanosis or edema  Pulses:   2+ and symmetric all extremities  Skin:   Skin color, texture, turgor normal, no rashes or lesions    GYN OFFICE PROCEDURE:  The indications for endometrial biopsy were reviewed.   Risks of the biopsy including cramping, bleeding, infection, uterine perforation, inadequate specimen and need for additional procedures  were discussed. The patient states she  understands and agrees to undergo procedure today. Consent was signed. Time out was performed. Urine HCG was negative. A sterile speculum was placed in the patient's vagina and the cervix was prepped with Betadine. A single-toothed tenaculum was placed on the anterior lip of the cervix to stabilize it. The 3 mm pipelle was introduced into the endometrial cavity without difficulty to a depth of 11cm, and after 3 passes it looked mostly of dark colored blood. I am unsure of how much tissue was in the specimen. This was sent to pathology. The instruments were removed from the patient's vagina. Minimal bleeding from the cervix was noted. The patient tolerated the procedure well. Routine post-procedure instructions were given to the patient. The patient will follow up to review the results and for further management.    Assessment:     Healthy female exam.   AUB- this is persistent despite a LnIUD and Megace. I have discussed with pt the risks of continued bleeding as well as the inconvenience of daily bleeding. I reviewed with her the tx options. I believe that her uterus is too deep for an ablation. Given that she is completed with childbearing a hysterectomy is likely her best option.  She has had 1 c/s however, she has good descensus and would be a good candidate for a TVH or a RATH. Pt would like to review her options and think it over and get back to me. She would like to f/u to discuss.  In the meantime we will increase her dosage of Megace.         Plan:  F/u surge path from Endo bx Megace: increase from 40mg  daily to 40mg  bid LnIUD. Strings not visualized but, an US reveals that it is in place (09/07/2018).  F/u in 3-4 weeks to discuss management of AUB rec f/u with ENT to discuss management of goiter.   Dickie Cloe L. Harraway-Smith, M.D., Yvette Perry

## 2020-05-23 NOTE — Patient Instructions (Signed)
Hysterectomy Information  A hysterectomy is a surgery in which the uterus is removed. The fallopian tubes and ovaries may be removed (bilateral salpingo-oophorectomy) as well. This procedure may be done to treat various medical problems. After the procedure, a woman will no longer have menstrual periods nor will she be able to become pregnant (sterile). What are the reasons for a hysterectomy? There are many reasons why a woman might have this procedure. They include:  Persistent, abnormal vaginal bleeding.  Long-term (chronic) pelvic pain or infection.  Endometriosis. This is when the lining of the uterus (endometrium) starts to grow outside the uterus.  Adenomyosis. This is when the endometrium starts to grow in the muscle of the uterus.  Pelvic organ prolapse. This is a condition in which the uterus falls down into the vagina.  Noncancerous growths in the uterus (uterine fibroids) that cause symptoms.  The presence of precancerous cells.  Cervical or uterine cancer. What are the different types of hysterectomy? There are three different types of hysterectomy:  Supracervical hysterectomy. In this type, the top part of the uterus is removed, but not the cervix.  Total hysterectomy. In this type, the uterus and cervix are removed.  Radical hysterectomy. In this type, the uterus, the cervix, and the tissue that holds the uterus in place (parametrium) are removed. What are the different ways a hysterectomy can be performed? There are many different ways a hysterectomy can be performed, including:  Abdominal hysterectomy. In this type, an incision is made in the abdomen. The uterus is removed through this incision.  Vaginal hysterectomy. In this type, an incision is made in the vagina. The uterus is removed through this incision. There are no abdominal incisions.  Conventional laparoscopic hysterectomy. In this type, three or four small incisions are made in the abdomen. A thin,  lighted tube with a camera (laparoscope) is inserted into one of the incisions. Other tools are put through the other incisions. The uterus is cut into small pieces. The small pieces are removed through the incisions or through the vagina.  Laparoscopically assisted vaginal hysterectomy (LAVH). In this type, three or four small incisions are made in the abdomen. Part of the surgery is performed laparoscopically and the other part is done vaginally. The uterus is removed through the vagina.  Robot-assisted laparoscopic hysterectomy. In this type, a laparoscope and other tools are inserted into three or four small incisions in the abdomen. A computer-controlled device is used to give the surgeon a 3D image and to help control the surgical instruments. This allows for more precise movements of surgical instruments. The uterus is cut into small pieces and removed through the incisions or removed through the vagina. Discuss the options with your health care provider to determine which type is the right one for you. What are the risks? Generally, this is a safe procedure. However, problems may occur, including:  Bleeding and risk of blood transfusion. Tell your health care provider if you do not want to receive any blood products.  Blood clots in the legs or lung.  Infection.  Damage to other structures or organs.  Allergic reactions to medicines.  Changing to an abdominal hysterectomy from one of the other techniques. What to expect after a hysterectomy  You will be given pain medicine.  You may need to stay in the hospital for 1- 2 days to recover, depending on the type of hysterectomy you had.  Follow your health care provider's instructions about exercise, driving, and general activities. Ask your   health care provider what activities are safe for you.  You will need to have someone with you for the first 3-5 days after you go home.  You will need to follow up with your surgeon in 2-4  weeks after surgery to evaluate your progress.  If the ovaries are removed, you will have early menopause symptoms such as hot flashes, night sweats, and insomnia.  If you had a hysterectomy for a problem that was not cancer or not a condition that could lead to cancer, then you no longer need Pap tests. However, even if you no longer need a Pap test, a regular pelvic exam is a good idea to make sure no other problems are developing. Questions to ask your health care provider  Is a hysterectomy medically necessary? Do I have other treatment options for my condition?  What are my options for hysterectomy procedure?  What organs and tissues need to be removed?  What are the risks?  What are the benefits?  How long will I need to stay in the hospital after the procedure?  How long will I need to recover at home?  What symptoms can I expect after the procedure? Summary  A hysterectomy is a surgery in which the uterus is removed. The fallopian tubes and ovaries may be removed (bilateral salpingo-oophorectomy) as well.  This procedure may be done to treat various medical problems. After the procedure, a woman will no longer have menstrual periods nor will she be able to become pregnant.  Discuss the options with your health care provider to determine which type of hysterectomy is the right one for you. This information is not intended to replace advice given to you by your health care provider. Make sure you discuss any questions you have with your health care provider. Document Revised: 09/13/2017 Document Reviewed: 11/07/2016 Elsevier Patient Education  2020 [place]. Total Laparoscopic Hysterectomy A total laparoscopic hysterectomy is a minimally invasive surgery to remove the uterus and cervix. The fallopian tubes and ovaries can also be removed (bilateral salpingo-oophorectomy) during this surgery, if necessary. This procedure may be done to treat problems such  as:  Noncancerous growths in the uterus (uterine fibroids) that cause symptoms.  A condition that causes the lining of the uterus (endometrium) to grow in other areas (endometriosis).  Problems with pelvic support. This is caused by weakened muscles of the pelvis following vaginal childbirth or menopause.  Cancer of the cervix, ovaries, uterus, or endometrium.  Excessive (dysfunctional) uterine bleeding. This surgery is performed by inserting a thin, lighted tube (laparoscope) and surgical instruments into small incisions in the abdomen. The laparoscope sends images to a monitor. The images help the health care provider perform the procedure. After this procedure, you will no longer be able to have a baby, and you will no longer have a menstrual period. Tell a health care provider about:  Any allergies you have.  All medicines you are taking, including vitamins, herbs, eye drops, creams, and over-the-counter medicines.  Any problems you or family members have had with anesthetic medicines.  Any blood disorders you have.  Any surgeries you have had.  Any medical conditions you have.  Whether you are pregnant or may be pregnant. What are the risks? Generally, this is a safe procedure. However, problems may occur, including:  Infection.  Bleeding.  Blood clots in the legs or lungs.  Allergic reactions to medicines.  Damage to other structures or organs.  The risk that the surgery may have to  be switched to the regular one in which a large incision is made in the abdomen (abdominal hysterectomy). What happens before the procedure? Staying hydrated Follow instructions from your health care provider about hydration, which may include:  Up to 2 hours before the procedure - you may continue to drink clear liquids, such as water, clear fruit juice, black coffee, and plain tea Eating and drinking restrictions Follow instructions from your health care provider about eating and  drinking, which may include:  8 hours before the procedure - stop eating heavy meals or foods such as meat, fried foods, or fatty foods.  6 hours before the procedure - stop eating light meals or foods, such as toast or cereal.  6 hours before the procedure - stop drinking milk or drinks that contain milk.  2 hours before the procedure - stop drinking clear liquids. Medicines  Ask your health care provider about: ? Changing or stopping your regular medicines. This is especially important if you are taking diabetes medicines or blood thinners. ? Taking over-the-counter medicines, vitamins, herbs, and supplements. ? Taking medicines such as aspirin and ibuprofen. These medicines can thin your blood. Do not take these medicines unless your health care provider tells you to take them.  You may be given antibiotic medicine to help prevent infection.  You may be asked to take laxatives.  You may be given medicines to help prevent nausea and vomiting after the procedure. General instructions  Ask your health care provider how your surgical site will be marked or identified.  You may be asked to shower with a germ-killing soap.  Do not use any products that contain nicotine or tobacco, such as cigarettes and e-cigarettes. If you need help quitting, ask your health care provider.  You may have an exam or testing, such as an ultrasound to determine the size and shape of your pelvic organs.  You may have a blood or urine sample taken.  This procedure can affect the way you feel about yourself. Talk with your health care provider about the physical and emotional changes hysterectomy may cause.  Plan to have someone take you home from the hospital or clinic.  Plan to have a responsible adult care for you for at least 24 hours after you leave the hospital or clinic. This is important. What happens during the procedure?  To lower your risk of infection: ? Your health care team will wash or  sanitize their hands. ? Your skin will be washed with soap. ? Hair may be removed from the surgical area.  An IV will be inserted into one of your veins.  You will be given one or more of the following: ? A medicine to help you relax (sedative). ? A medicine to make you fall asleep (general anesthetic).  You will be given antibiotic medicine through your IV.  A tube may be inserted down your throat to help you breathe during the procedure.  A gas (carbon dioxide) will be used to inflate your abdomen to allow your surgeon to see inside of your abdomen.  Three or four small incisions will be made in your abdomen.  A laparoscope will be inserted into one of your incisions. Surgical instruments will be inserted through the other incisions in order to perform the procedure.  Your uterus and cervix may be removed through your vagina or cut into small pieces and removed through the small incisions. Any other organs that need to be removed will also be removed this way.  Carbon dioxide will be released from inside of your abdomen.  Your incisions will be closed with stitches (sutures).  A bandage (dressing) may be placed over your incisions. The procedure may vary among health care providers and hospitals. What happens after the procedure?  Your blood pressure, heart rate, breathing rate, and blood oxygen level will be monitored until the medicines you were given have worn off.  You will be given medicine for pain and nausea as needed.  Do not drive for 24 hours if you received a sedative. Summary  Total Laparoscopic hysterectomy is a procedure to remove your uterus, cervix and sometimes the fallopian tubes and ovaries.  This procedure can affect the way you feel about yourself. Talk with your health care provider about the physical and emotional changes hysterectomy may cause.  After this procedure, you will no longer be able to have a baby, and you will no longer have a menstrual  period.  You will be given pain medicine to control discomfort after this procedure. This information is not intended to replace advice given to you by your health care provider. Make sure you discuss any questions you have with your health care provider. Document Revised: 09/13/2017 Document Reviewed: 12/12/2016 Elsevier Patient Education  2020 [place]. Vaginal Hysterectomy  A vaginal hysterectomy is a procedure to remove all or part of the uterus through a small incision in the vagina. In this procedure, your health care provider may remove your entire uterus, including the lower end (cervix). You may need a vaginal hysterectomy to treat:  Uterine fibroids.  A condition that causes the lining of the uterus to grow in other areas (endometriosis).  Problems with pelvic support.  Cancer of the cervix, ovaries, uterus, or tissue that lines the uterus (endometrium).  Excessive (dysfunctional) uterine bleeding. When removing your uterus, your health care provider may also remove the organs that produce eggs (ovaries) and the tubes that carry eggs to your uterus (fallopian tubes). After a vaginal hysterectomy, you will no longer be able to have a baby. You will also no longer get your menstrual period. Tell a health care provider about:  Any allergies you have.  All medicines you are taking, including vitamins, herbs, eye drops, creams, and over-the-counter medicines.  Any problems you or family members have had with anesthetic medicines.  Any blood disorders you have.  Any surgeries you have had.  Any medical conditions you have.  Whether you are pregnant or may be pregnant. What are the risks? Generally, this is a safe procedure. However, problems may occur, including:  Bleeding.  Infection.  A blood clot that forms in your leg and travels to your lungs (pulmonary embolism).  Damage to surrounding organs.  Pain during sex. What happens before the procedure?  Ask  your health care provider what organs will be removed during surgery.  Ask your health care provider about: ? Changing or stopping your regular medicines. This is especially important if you are taking diabetes medicines or blood thinners. ? Taking medicines such as aspirin and ibuprofen. These medicines can thin your blood. Do not take these medicines before your procedure if your health care provider instructs you not to.  Follow instructions from your health care provider about eating or drinking restrictions.  Do not use any tobacco products, such as cigarettes, chewing tobacco, and e-cigarettes. If you need help quitting, ask your health care provider.  Plan to have someone take you home after discharge from the hospital. What happens during the  procedure?  To reduce your risk of infection: ? Your health care team will wash or sanitize their hands. ? Your skin will be washed with soap.  An IV tube will be inserted into one of your veins.  You may be given antibiotic medicine to help prevent infection.  You will be given one or more of the following: ? A medicine to help you relax (sedative). ? A medicine to numb the area (local anesthetic). ? A medicine to make you fall asleep (general anesthetic). ? A medicine that is injected into an area of your body to numb everything beyond the injection site (regional anesthetic).  Your surgeon will make an incision in your vagina.  Your surgeon will locate and remove all or part of your uterus.  Your ovaries and fallopian tubes may be removed at the same time.  The incision will be closed with stitches (sutures) that dissolve over time. The procedure may vary among health care providers and hospitals. What happens after the procedure?  Your blood pressure, heart rate, breathing rate, and blood oxygen level will be monitored often until the medicines you were given have worn off.  You will be encouraged to get up and walk around  after a few hours to help prevent complications.  You may have IV tubes in place for a few days.  You will be given pain medicine as needed.  Do not drive for 24 hours if you were given a sedative. This information is not intended to replace advice given to you by your health care provider. Make sure you discuss any questions you have with your health care provider. Document Revised: 05/24/2016 Document Reviewed: 10/16/2015 Elsevier Patient Education  2020 [employer].

## 2020-05-24 LAB — SURGICAL PATHOLOGY

## 2020-06-22 ENCOUNTER — Ambulatory Visit: Payer: Managed Care, Other (non HMO) | Admitting: Obstetrics & Gynecology

## 2021-03-30 ENCOUNTER — Encounter: Payer: Self-pay | Admitting: *Deleted

## 2021-04-04 ENCOUNTER — Encounter: Payer: Self-pay | Admitting: Neurology

## 2021-04-04 ENCOUNTER — Ambulatory Visit: Payer: Managed Care, Other (non HMO) | Admitting: Neurology

## 2021-04-04 ENCOUNTER — Other Ambulatory Visit: Payer: Self-pay

## 2021-04-04 VITALS — BP 168/98 | HR 82 | Wt 298.0 lb

## 2021-04-04 DIAGNOSIS — R51 Headache with orthostatic component, not elsewhere classified: Secondary | ICD-10-CM | POA: Diagnosis not present

## 2021-04-04 DIAGNOSIS — G43009 Migraine without aura, not intractable, without status migrainosus: Secondary | ICD-10-CM

## 2021-04-04 DIAGNOSIS — R519 Headache, unspecified: Secondary | ICD-10-CM

## 2021-04-04 MED ORDER — TOPIRAMATE 50 MG PO TABS: ORAL_TABLET | ORAL | 6 refills | Status: DC

## 2021-04-04 MED ORDER — RIZATRIPTAN BENZOATE 10 MG PO TBDP
10.0000 mg | ORAL_TABLET | ORAL | 11 refills | Status: DC | PRN
Start: 2021-04-04 — End: 2024-04-29

## 2021-04-04 MED ORDER — ONDANSETRON 4 MG PO TBDP: 4.0000 mg | ORAL_TABLET | Freq: Three times a day (TID) | ORAL | 3 refills | Status: AC | PRN

## 2021-04-04 NOTE — Patient Instructions (Signed)
MRI of the brain (will call) Start Topiramate for migraine prevention Rizatriptan for emergency rescue: Please take one tablet at the onset of your headache. If it does not improve the symptoms please take one additional tablet. Do not take more then 2 tablets in 24hrs. Do not take use more then 2 to 3 times in a week. Ondansetron: may take with rizatriptan or alone for nausea or dizziness The next medication I would try is Qulipta(Atogepant) for prevention if Topiramate does not help or you have side effects  Atogepant tablets (If topamax does not help or you have side effects) What is this medication? ATOGEPANT (a TOE je pant) is used to prevent migraine headaches. This medicine may be used for other purposes; ask your health care provider orpharmacist if you have questions. COMMON BRAND NAME(S): QULIPTA What should I tell my care team before I take this medication? They need to know if you have any of these conditions: kidney disease liver disease an unusual or allergic reaction to atogepant, other medicines, foods, dyes, or preservatives pregnant or trying to get pregnant breast-feeding How should I use this medication? Take this medicine by mouth with water. Take it as directed on the prescription label at the same time every day. You can take it with or without food. If it upsets your stomach, take it with food. Keep taking it unless your health careprovider tells you to stop. Talk to your health care provider about the use of this medicine in children.Special care may be needed. Overdosage: If you think you have taken too much of this medicine contact apoison control center or emergency room at once. NOTE: This medicine is only for you. Do not share this medicine with others. What if I miss a dose? If you miss a dose, take it as soon as you can. If it is almost time for yournext dose, take only that dose. Do not take double or extra doses. What may interact with this  medication? carbamazepine certain medicines for fungal infections like itraconazole, ketoconazole clarithromycin cyclosporine efavirenz etravirine phenytoin rifampin [facility] John's Wort This list may not describe all possible interactions. Give your health care provider a list of all the medicines, herbs, non-prescription drugs, or dietary supplements you use. Also tell them if you smoke, drink alcohol, or use illegaldrugs. Some items may interact with your medicine. What should I watch for while using this medication? Visit your health care provider for regular checks on your progress. Tell your health care provider if your symptoms do not start to get better or if they getworse. What side effects may I notice from receiving this medication? Side effects that you should report to your doctor or health care provider assoon as possible: allergic reactions (skin rash, itching or hives; swelling of the face, lips, tongue) light-colored stool liver injury (dark yellow or brown urine; general ill feeling or flu-like symptoms; loss of appetite, right upper belly pain; unusually weak or tired, yellowing of the eyes or skin) Side effects that usually do not require medical attention (report these toyour doctor or health care provider if they continue or are bothersome): constipation lack or loss of appetite nausea unusually weak or tired weight loss This list may not describe all possible side effects. Call your doctor for medical advice about side effects. You may report side effects to FDA at1-800-FDA-1088. Where should I keep my medication? Keep out of the reach of children and pets. Store at room temperature between 20 and 25 degrees C (68 and  77 degrees F).Get rid of any unused medicine after the expiration date. To get rid of medicines that are no longer needed or have expired: Take the medicine to a medicine take-back program. Check with your pharmacy or law enforcement to find a  location. If you cannot return the medicine, check the label or package insert to see if the medicine should be thrown out in the garbage or flushed down the toilet. If you are not sure, ask your health care provider. If it is safe to put it in the trash, take the medicine out of the container. Mix the medicine with cat litter, dirt, coffee grounds, or other unwanted substance. Seal the mixture in a bag or container. Put it in the trash. NOTE: This sheet is a summary. It may not cover all possible information. If you have questions about this medicine, talk to your doctor, pharmacist, orhealth care provider.  2022 Elsevier/Gold Standard (2020-07-14 11:20:03) Ondansetron Dissolving Tablets What is this medication? ONDANSETRON (on DAN se tron) prevents nausea and vomiting from chemotherapy, radiation, or surgery. It works by blocking substances in the body that may cause nausea or vomiting. It belongs to a group of medications calledantiemetics. This medicine may be used for other purposes; ask your health care provider orpharmacist if you have questions. COMMON BRAND NAME(S): Zofran ODT What should I tell my care team before I take this medication? They need to know if you have any of these conditions: Heart disease History of irregular heartbeat Liver disease Low levels of magnesium or potassium in the blood An unusual or allergic reaction to ondansetron, granisetron, other medications, foods, dyes, or preservatives Pregnant or trying to get pregnant Breast-feeding How should I use this medication? These tablets are made to dissolve in the mouth. Do not try to push the tablet through the foil backing. With dry hands, peel away the foil backing and gently remove the tablet. Place the tablet in the mouth and allow it to dissolve, then swallow. While you may take these tablets with water, it is not necessary to doso. Talk to your care team regarding the use of this medication in children.Special  care may be needed. Overdosage: If you think you have taken too much of this medicine contact apoison control center or emergency room at once. NOTE: This medicine is only for you. Do not share this medicine with others. What if I miss a dose? If you miss a dose, take it as soon as you can. If it is almost time for yournext dose, take only that dose. Do not take double or extra doses. What may interact with this medication? Do not take this medication with any of the following: Apomorphine Certain medications for fungal infections like fluconazole, itraconazole, ketoconazole, posaconazole, voriconazole Cisapride Dronedarone Pimozide Thioridazine This medication may also interact with the following: Carbamazepine Certain medications for depression, anxiety, or psychotic disturbances Fentanyl Linezolid MAOIs like Carbex, Eldepryl, Marplan, Nardil, and Parnate Methylene blue (injected into a vein) Other medications that prolong the QT interval (cause an abnormal heart rhythm) like dofetilide, ziprasidone Phenytoin Rifampicin Tramadol This list may not describe all possible interactions. Give your health care provider a list of all the medicines, herbs, non-prescription drugs, or dietary supplements you use. Also tell them if you smoke, drink alcohol, or use illegaldrugs. Some items may interact with your medicine. What should I watch for while using this medication? Check with your care team as soon as you can if you have any sign of anallergic reaction. What  side effects may I notice from receiving this medication? Side effects that you should report to your care team as soon as possible: Allergic reactions-skin rash, itching, hives, swelling of the face, lips, tongue, or throat Bowel blockage-stomach cramping, unable to have a bowel movement or pass gas, loss of appetite, vomiting Chest pain (angina)-pain, pressure, or tightness in the chest, neck, back, or arms Heart rhythm  changes-fast or irregular heartbeat, dizziness, feeling faint or lightheaded, chest pain, trouble breathing Irritability, confusion, fast or irregular heartbeat, muscle stiffness, twitching muscles, sweating, high fever, seizure, chills, vomiting, diarrhea, which may be signs of serotonin syndrome Side effects that usually do not require medical attention (report to your careteam if they continue or are bothersome): Constipation Diarrhea General discomfort and fatigue Headache This list may not describe all possible side effects. Call your doctor for medical advice about side effects. You may report side effects to FDA at1-800-FDA-1088. Where should I keep my medication? Keep out of the reach of children and pets. Store between 2 and 30 degrees C (36 and 86 degrees F). Throw away any unusedmedication after the expiration date. NOTE: This sheet is a summary. It may not cover all possible information. If you have questions about this medicine, talk to your doctor, pharmacist, orhealth care provider.  2022 Else[employer]/Gold Standard (2020-10-21 14:39:19) Rizatriptan Disintegrating Tablets What is this medication? RIZATRIPTAN (rye za TRIP tan) treats migraines. It works by blocking pain signals and narrowing blood vessels in the brain. It belongs to a group ofmedications called triptans. It is not used to prevent migraines. This medicine may be used for other purposes; ask your health care provider orpharmacist if you have questions. COMMON BRAND NAME(S): Maxalt-MLT What should I tell my care team before I take this medication? They need to know if you have any of these conditions: Cigarette smoker Circulation problems in fingers and toes Diabetes Heart disease High blood pressure High cholesterol History of irregular heartbeat History of stroke Kidney disease Liver disease Stomach or intestine problems An unusual or allergic reaction to rizatriptan, other medications, foods, dyes, or  preservatives Pregnant or trying to get pregnant Breast-feeding How should I use this medication? Take this medication by mouth. Follow the directions on the prescription label. Leave the tablet in the sealed blister pack until you are ready to take it. With dry hands, open the blister and gently remove the tablet. If the tablet breaks or crumbles, throw it away and take a new tablet out of the blister pack. Place the tablet in the mouth and allow it to dissolve, and then swallow. Do not cut, crush, or chew this medication. You do not need water to take thismedication. Do not take it more often than directed. Talk to your care team regarding the use of this medication in children. While this medication may be prescribed for children as young as 6 years for selectedconditions, precautions do apply. Overdosage: If you think you have taken too much of this medicine contact apoison control center or emergency room at once. NOTE: This medicine is only for you. Do not share this medicine with others. What if I miss a dose? This does not apply. This medication is not for regular use. What may interact with this medication? Do not take this medication with any of the following medications: Certain medications for migraine headache like almotriptan, eletriptan, frovatriptan, naratriptan, rizatriptan, sumatriptan, zolmitriptan Ergot alkaloids like dihydroergotamine, ergonovine, ergotamine, methylergonovine MAOIs like Carbex, Eldepryl, Marplan, Nardil, and Parnate This medication may also interact with  the following medications: Certain medications for depression, anxiety, or psychotic disorders Propranolol This list may not describe all possible interactions. Give your health care provider a list of all the medicines, herbs, non-prescription drugs, or dietary supplements you use. Also tell them if you smoke, drink alcohol, or use illegaldrugs. Some items may interact with your medicine. What should I watch  for while using this medication? Visit your care team for regular checks on your progress. Tell your care teamif your symptoms do not start to get better or if they get worse. You may get drowsy or dizzy. Do not drive, use machinery, or do anything that needs mental alertness until you know how this medication affects you. Do not stand up or sit up quickly, especially if you are an older patient. This reduces the risk of dizzy or fainting spells. Alcohol may interfere with theeffect of this medication. Your mouth may get dry. Chewing sugarless gum or sucking hard candy and drinking plenty of water may help. Contact your care team if the problem doesnot go away or is severe. If you take migraine medications for 10 or more days a month, your migraines may get worse. Keep a diary of headache days and medication use. Contact yourcare team if your migraine attacks occur more frequently. What side effects may I notice from receiving this medication? Side effects that you should report to your care team as soon as possible: Allergic reactions-skin rash, itching, hives, swelling of the face, lips, tongue, or throat Burning, pain, tingling, or color changes in the legs or feet Heart attack-pain or tightness in the chest, shoulders, arms, or jaw, nausea, shortness of breath, cold or clammy skin, feeling faint or lightheaded Heart rhythm changes-fast or irregular heartbeat, dizziness, feeling faint or lightheaded, chest pain, trouble breathing Increase in blood pressure Irritability, confusion, fast or irregular heartbeat, muscle stiffness, twitching muscles, sweating, high fever, seizure, chills, vomiting, diarrhea, which may be signs of serotonin syndrome Raynaud's-cool, numb, or painful fingers or toes that may change color from pale, to blue, to red Seizures Stroke-sudden numbness or weakness of the face, arm, or leg, trouble speaking, confusion, trouble walking, loss of balance or coordination, dizziness,  severe headache, change in vision Sudden or severe stomach pain, nausea, vomiting, fever, or bloody diarrhea Vision loss Side effects that usually do not require medical attention (report to your careteam if they continue or are bothersome): Dizziness General discomfort or fatigue This list may not describe all possible side effects. Call your doctor for medical advice about side effects. You may report side effects to FDA at1-800-FDA-1088. Where should I keep my medication? Keep out of the reach of children and pets. Store at room temperature between 15 and 30 degrees C (59 and 86 degrees F). Protect from light and moisture. Throw away any unused medication after theexpiration date. NOTE: This sheet is a summary. It may not cover all possible information. If you have questions about this medicine, talk to your doctor, pharmacist, orhealth care provider.  2022 Else[employer]/Gold Standard (2020-10-26 16:19:19) Topiramate tablets What is this medication? TOPIRAMATE (toe PYRE a mate) treats seizures in people with epilepsy. It is also used to prevent migraines. It works by calming overactive nerves in yourbody. This medicine may be used for other purposes; ask your health care provider orpharmacist if you have questions. COMMON BRAND NAME(S): Topamax, Topiragen What should I tell my care team before I take this medication? They need to know if you have any of these conditions: Bleeding disorder Kidney  disease Lung disease Suicidal thoughts, plans or attempt An unusual or allergic reaction to topiramate, other medications, foods, dyes, or preservatives Pregnant or trying to get pregnant Breast-feeding How should I use this medication? Take this medication by mouth with water. Take it as directed on the prescription label at the same time every day. Do not cut, crush or chew this medicine. Swallow the tablets whole. You can take it with or without food. If it upsets your stomach, take it with food.  Keep taking it unless your care teamtells you to stop. A special MedGuide will be given to you by the pharmacist with eachprescription and refill. Be sure to read this information carefully each time. Talk to your care team about the use of this medication in children. While it may be prescribed for children as young as 2 years for selected conditions,precautions do apply. Overdosage: If you think you have taken too much of this medicine contact apoison control center or emergency room at once. NOTE: This medicine is only for you. Do not share this medicine with others. What if I miss a dose? If you miss a dose, take it as soon as you can unless it is within 6 hours of the next dose. If it is within 6 hours of the next dose, skip the missed dose.Take the next dose at the normal time. Do not take double or extra doses. What may interact with this medication? Acetazolamide Alcohol Antihistamines for allergy, cough, and cold Aspirin and aspirin-like medications Atropine Birth control pills Certain medications for anxiety or sleep Certain medications for bladder problems like oxybutynin, tolterodine Certain medications for depression like amitriptyline, fluoxetine, sertraline Certain medications for Parkinson's disease like benztropine, trihexyphenidyl Certain medications for seizures like carbamazepine, lamotrigine, phenobarbital, phenytoin, primidone, valproic acid, zonisamide Certain medications for stomach problems like dicyclomine, hyoscyamine Certain medications for travel sickness like scopolamine Certain medications that treat or prevent blood clots like warfarin, enoxaparin, dalteparin, apixaban, dabigatran, and rivaroxaban Digoxin Diltiazem General anesthetics like halothane, isoflurane, methoxyflurane, propofol Glyburide Hydrochlorothiazide Ipratropium Lithium Medications that relax muscles Metformin Narcotic medications for pain NSAIDs, medications for pain and inflammation,  like ibuprofen or naproxen Phenothiazines like chlorpromazine, mesoridazine, prochlorperazine, thioridazine Pioglitazone This list may not describe all possible interactions. Give your health care provider a list of all the medicines, herbs, non-prescription drugs, or dietary supplements you use. Also tell them if you smoke, drink alcohol, or use illegaldrugs. Some items may interact with your medicine. What should I watch for while using this medication? Visit your care team for regular checks on your progress. Tell your care teamif your symptoms do not start to get better or if they get worse. Do not suddenly stop taking this medication. You may develop a severe reaction. Your care team will tell you how much medication to take. If your care team wants you to stop the medication, the dose may be slowly lowered over time toavoid any side effects. Wear a medical ID bracelet or chain. Carry a card that describes yourcondition. List the medications and doses you take on the card. You may get drowsy or dizzy. Do not drive, use machinery, or do anything that needs mental alertness until you know how this medication affects you. Do not stand up or sit up quickly, especially if you are an older patient. This reduces the risk of dizzy or fainting spells. Alcohol may interfere with theeffects of this medication. Avoid alcoholic drinks. This medication may cause serious skin reactions. They can happen weeks to months  after starting the medication. Contact your care team right away if you notice fevers or flu-like symptoms with a rash. The rash may be red or purple and then turn into blisters or peeling of the skin. Or, you might notice a red rash with swelling of the face, lips or lymph nodes in your neck or under yourarms. Watch for new or worsening thoughts of suicide or depression. This includes sudden changes in mood, behaviors, or thoughts. These changes can happen at any time but are more common in the  beginning of treatment or after a change in dose. Call your care team right away if you experience these thoughts orworsening depression. This medication may slow your child's growth if it is taken for a long time athigh doses. Your care team will monitor your child's growth. Using this medication for a long time may weaken your bones. The risk of bonefractures may be increased. Talk to your care team about your bone health. Do not become pregnant while taking this medication. Hormone forms of birth control may not work as well with this medication. Talk to your care team about other forms of birth control. There is potential for serious harm to an unbornchild. Tell your care team right away if you think you might be pregnant. What side effects may I notice from receiving this medication? Side effects that you should report to your care team as soon as possible: Allergic reactions-skin rash, itching, hives, swelling of the face, lips, tongue, or throat High acid levels-trouble breathing, fast irregular heartbeat, headache, confusion, unusually weak or tired, nausea, vomiting High ammonia levels-unusual weakness or fatigue, confusion, loss of appetite, nausea, vomiting, seizures High fever, fever that does not go away, or decreased sweating Kidney stones-blood in the urine, pain or trouble passing urine, pain in the lower back or sides Redness, blistering, peeling or loosening of the skin, including inside the mouth Sudden eye pain or change in vision such as blurry vision, seeing halos around lights, vision loss Thoughts of suicide or self-harm, worsening mood, feelings of depression Side effects that usually do not require medical attention (report to your careteam if they continue or are bothersome): Anxiety, nervousness Change in taste Diarrhea Dizziness Drowsiness Fatigue Loss of appetite with weight loss Pain, tingling, or numbness in the hands or feet Trouble concentrating Trouble  speaking This list may not describe all possible side effects. Call your doctor for medical advice about side effects. You may report side effects to FDA at1-800-FDA-1088. Where should I keep my medication? Keep out of the reach of children and pets. Store between 15 and 30 degrees C (59 and 86 degrees F). Protect from moisture. Keep the container tightly closed. Get rid of any unused medication after theexpiration date. To get rid of medications that are no longer needed or have expired: Take the medication to a medication take-back program. Check with your pharmacy or law enforcement to find a location. If you cannot return the medication, check the label or package insert to see if the medication should be thrown out in the garbage or flushed down the toilet. If you are not sure, ask your care team. If it is safe to put it in the trash, empty the medication out of the container. Mix the medication with cat litter, dirt, coffee grounds, or other unwanted substance. Seal the mixture in a bag or container. Put it in the trash. NOTE: This sheet is a summary. It may not cover all possible information. If you have questions  about this medicine, talk to your doctor, pharmacist, orhealth care provider.  2022 [employer]/Gold Standard (2020-11-14 10:05:08)

## 2021-04-04 NOTE — Progress Notes (Signed)
[facility]    Provider:  Dr Jaynee Eagles Requesting Provider: Tempie Hoist, MD Primary Care Provider:  Tempie Hoist, MD  CC:  migraines  HPI:  TAMERRA Perry is a 45 y.o. female here as requested by Tempie Hoist, MD for migraines. PMHx DM2 on insulin, HTN, morbid obesity, migrain wo aura.  I reviewed notes provided by Dr. Colonel Bald and there was no significant history provided on her migraines, I reviewed epic and I also reviewed "care everywhere" and there were no notes on migraines and no brain imaging.  Patient is here alone, she suffers with migraine headaches for years, she has noticed more recently the intensity has worsened, becoming more frequent and lasting longer, light sensitivity, Fioricet does not help, she is having 5-6 migraines a month. Progressively worse. Started 20 years after childbirth. Migraines are in the temples and radiate to the neck, photo/phono[hobia, a dark room helps, it is debilitating due to severe pain, pounding, throbbing, also sharp, nausea, no vomiting. Worse during period, she has had tubal ligation. She wakes up with headaches, used to work fioricet but not more, they can go on for 2- 3 days. 5-6 migraine days a month, no aura, if she moves they are worse especially bending over they are worse (positional) like bending down to get something, worsening, more frequent and severe. No other headaches. Ongoing for over a year at this severity and frequency. No other focal neurologic deficits, associated symptoms, inciting events or modifiable factors.   Reviewed notes, labs and imaging from outside physicians, which showed:  From a thorough review of records, medications tried that can be used in migraine management include: Propranol contraindicated due to asthma, amitriptyline tried in the past.   CBC April 2022 showed some mild anemia otherwise unremarkable, CMP is normal with BUN 12 and creatinine 0.66, thyroid was normal 1.6.  Review  of Systems: Patient complains of symptoms per HPI as well as the following symptoms headaches. Pertinent negatives and positives per HPI. All others negative.   Social History   Socioeconomic History   Marital status: Single    Spouse name: Not on file   Number of children: Not on file   Years of education: Not on file   Highest education level: Not on file  Occupational History   Not on file  Tobacco Use   Smoking status: Never   Smokeless tobacco: Never  Substance and Sexual Activity   Alcohol use: Yes   Drug use: No   Sexual activity: Yes    Birth control/protection: I.U.D.    Comment: tubal ligation  Other Topics Concern   Not on file  Social History Narrative   Not on file   Social Determinants of Health   Financial Resource Strain: Not on file  Food Insecurity: Not on file  Transportation Needs: Not on file  Physical Activity: Not on file  Stress: Not on file  Social Connections: Not on file  Intimate Partner Violence: Not on file    Family History  Problem Relation Age of Onset   Hypertension Father    Diabetes Father    Kidney failure Father    Hypertension Mother     Past Medical History:  Diagnosis Date   Abnormal Pap smear of cervix    LGSIL   Asthma    Diabetes mellitus ([place])    Goiter    Hypertension    Migraine    Pre-diabetes    Uterine fibroid     Patient Active Problem  List   Diagnosis Date Noted   Migraine without aura and without status migrainosus, not intractable 04/04/2021    Past Surgical History:  Procedure Laterality Date   CESAREAN SECTION  06/2003   COLPOSCOPY  12/22/2008   normal   ENDOMETRIAL BIOPSY  06/30/2013   benign   TUBAL LIGATION  06/2003    Current Outpatient Medications  Medication Sig Dispense Refill   albuterol (PROVENTIL HFA;VENTOLIN HFA) 108 (90 BASE) MCG/ACT inhaler Inhale 2 puffs into the lungs every 6 (six) hours as needed. For shortness of breath      ascorbic acid (VITAMIN C) 500 MG tablet  Take 500 mg by mouth 3 (three) times daily. With rose hips     B Complex-C-Biotin-D-Zinc-FA (VITAL-D RX PO) Take 1,000 mg by mouth daily.     butalbital-acetaminophen-caffeine (FIORICET WITH CODEINE) 50-325-40-30 MG capsule Take 1 capsule by mouth every 4 (four) hours as needed for headache.     cholecalciferol (VITAMIN D3) 25 MCG (1000 UT) tablet Take 1,000 Units by mouth daily.     EPINEPHrine (EPIPEN 2-PAK) 0.3 mg/0.3 mL IJ SOAJ injection Inject 0.3 mLs (0.3 mg total) into the muscle once as needed (allergic reaction). 2 Device 0   ferrous sulfate 325 (65 FE) MG tablet Take 325 mg by mouth 3 (three) times daily with meals.     fexofenadine (ALLEGRA) 180 MG tablet Take 180 mg by mouth daily.     fluticasone (FLONASE) 50 MCG/ACT nasal spray Place 1 spray into the nose daily.      fluticasone-salmeterol (ADVAIR) 500-50 MCG/ACT AEPB Inhale 1 puff into the lungs in the morning and at bedtime.     ibuprofen (ADVIL,MOTRIN) 800 MG tablet Take 1 tablet (800 mg total) by mouth every 8 (eight) hours as needed for cramping. 30 tablet 2   ipratropium (ATROVENT) 0.03 % nasal spray Place 2 sprays into both nostrils 3 (three) times daily.     lisinopril-hydrochlorothiazide (PRINZIDE,ZESTORETIC) 20-12.5 MG per tablet Take 1 tablet by mouth daily.       megestrol (MEGACE) 40 MG tablet Take 40 mg by mouth. Takes if needed r/t cycle     metFORMIN (GLUCOPHAGE-XR) 500 MG 24 hr tablet Take 1,000 mg by mouth in the morning and at bedtime.     metroNIDAZOLE (FLAGYL) 500 MG tablet Take 1 tablet (500 mg total) by mouth 2 (two) times daily. 14 tablet 0   montelukast (SINGULAIR) 10 MG tablet Take 10 mg by mouth at bedtime.     ondansetron (ZOFRAN-ODT) 4 MG disintegrating tablet Take 1-2 tablets (4-8 mg total) by mouth every 8 (eight) hours as needed. 30 tablet 3   pantoprazole (PROTONIX) 40 MG tablet Take 40 mg by mouth daily.     prochlorperazine (COMPAZINE) 10 MG tablet Take 10 mg by mouth every 6 (six) hours as needed  for nausea or vomiting.     rizatriptan (MAXALT-MLT) 10 MG disintegrating tablet Take 1 tablet (10 mg total) by mouth as needed for migraine. May repeat in 2 hours if needed 9 tablet 11   topiramate (TOPAMAX) 50 MG tablet Start with one pill at bedtime(50mg ). In 2 weeks, if no side effects, increase to 2 pills (100mg ) at bedtime 60 tablet 6   No current facility-administered medications for this visit.    Allergies as of 04/04/2021   (No Known Allergies)    Vitals: BP (!) 168/98   Pulse 82   Wt 298 lb (135.2 kg)   BMI 48.10 kg/m  Last Weight:  Wt  Readings from Last 1 Encounters:  04/04/21 298 lb (135.2 kg)   Last Height:   Ht Readings from Last 1 Encounters:  05/23/20 5\' 6"  (1.676 m)     Physical exam: Exam: Gen: NAD, conversant, well nourised, obese, well groomed                     CV: RRR, no MRG. No Carotid Bruits. No peripheral edema, warm, nontender Eyes: Conjunctivae clear without exudates or hemorrhage  Neuro: Detailed Neurologic Exam  Speech:    Speech is normal; fluent and spontaneous with normal comprehension.  Cognition:    The patient is oriented to person, place, and time;     recent and remote memory intact;     language fluent;     normal attention, concentration,     fund of knowledge Cranial Nerves:    The pupils are equal, round, and reactive to light. The fundi are flat. Visual fields are full to finger confrontation. Extraocular movements are intact. Trigeminal sensation is intact and the muscles of mastication are normal. The face is symmetric. The palate elevates in the midline. Hearing intact. Voice is normal. Shoulder shrug is normal. The tongue has normal motion without fasciculations.   Coordination:    Normal   Gait:    No ataxia, slightly wide based due to large body habitus  Motor Observation:    No asymmetry, no atrophy, and no involuntary movements noted. Tone:    Normal muscle tone.    Posture:    Posture is normal. normal  erect    Strength:    Strength is V/V in the upper and lower limbs.      Sensation: intact to LT     Reflex Exam:  DTR's:    Deep tendon reflexes in the upper and lower extremities are normal bilaterally.   Toes:    The toes are downgoing bilaterally.   Clonus:    Clonus is absent.    Assessment/Plan: 45 year old very nice female here for headaches and migraines.  Likely episodic migraines.  But due to worsening symptoms and positional quality need MRI of the brain.  - MRI of the brain: MRI brain due to concerning symptoms of morning headaches, positional headaches to look for space occupying mass, chiari or intracranial hypertension (pseudotumor). - Start Topiramate for migraine prevention - Rizatriptan for emergency rescue: Please take one tablet at the onset of your headache. If it does not improve the symptoms please take one additional tablet. Do not take more then 2 tablets in 24hrs. Do not take use more then 2 to 3 times in a week. - Ondansetron: may take with rizatriptan or alone for nausea or dizziness - The next medication I would try is Qulipta(Atogepant) for prevention if Topiramate does not help or you have side effects - Wakes with morning headaches: may consider sleep test however she declines significant snoring or daytime somnolence. Lenoria Chime next    Orders Placed This Encounter  Procedures   MR BRAIN W WO CONTRAST    Meds ordered this encounter  Medications   topiramate (TOPAMAX) 50 MG tablet    Sig: Start with one pill at bedtime(50mg ). In 2 weeks, if no side effects, increase to 2 pills (100mg ) at bedtime    Dispense:  60 tablet    Refill:  6   rizatriptan (MAXALT-MLT) 10 MG disintegrating tablet    Sig: Take 1 tablet (10 mg total) by mouth as needed for migraine. May repeat in  2 hours if needed    Dispense:  9 tablet    Refill:  11   ondansetron (ZOFRAN-ODT) 4 MG disintegrating tablet    Sig: Take 1-2 tablets (4-8 mg total) by mouth every 8 (eight)  hours as needed.    Dispense:  30 tablet    Refill:  3   Discussed: To prevent or relieve headaches, try the following: Cool Compress. Lie down and place a cool compress on your head.  Avoid headache triggers. If certain foods or odors seem to have triggered your migraines in the past, avoid them. A headache diary might help you identify triggers.  Include physical activity in your daily routine. Try a daily walk or other moderate aerobic exercise.  Manage stress. Find healthy ways to cope with the stressors, such as delegating tasks on your to-do list.  Practice relaxation techniques. Try deep breathing, yoga, massage and visualization.  Eat regularly. Eating regularly scheduled meals and maintaining a healthy diet might help prevent headaches. Also, drink plenty of fluids.  Follow a regular sleep schedule. Sleep deprivation might contribute to headaches Consider biofeedback. With this mind-body technique, you learn to control certain bodily functions -- such as muscle tension, heart rate and blood pressure -- to prevent headaches or reduce headache pain.    Proceed to emergency room if you experience new or worsening symptoms or symptoms do not resolve, if you have new neurologic symptoms or if headache is severe, or for any concerning symptom.   Provided education and documentation from American headache Society toolbox including articles on: chronic migraine medication overuse headache, chronic migraines, prevention of migraines, behavioral and other nonpharmacologic treatments for headache.  I spent 60 minutes of face-to-face and non-face-to-face time with patient on the  1. Migraine without aura and without status migrainosus, not intractable   2. Positional headache   3. Morning headache   4. Worsening headaches    diagnosis.  This included previsit chart review, lab review, study review, order entry, electronic health record documentation, patient education on the different diagnostic  and therapeutic options, counseling and coordination of care, risks and benefits of management, compliance, or risk factor reduction  Cc: Tempie Hoist, MD,  Tempie Hoist, MD  Sarina Ill, MD  [facility] [address] [place], [place] [address]  Phone [phone] Fax [phone]

## 2021-04-05 ENCOUNTER — Telehealth: Payer: Self-pay | Admitting: Neurology

## 2021-04-05 NOTE — Addendum Note (Signed)
Addended by: Sarina Ill B on: 04/05/2021 12:41 PM   Modules accepted: Level of Service

## 2021-04-05 NOTE — Telephone Encounter (Signed)
MRI brain w/wo contrast: sent to GI for scheduling (they obtain Novella Rob).

## 2021-04-22 ENCOUNTER — Other Ambulatory Visit: Payer: Managed Care, Other (non HMO)

## 2021-07-03 ENCOUNTER — Telehealth: Payer: Self-pay | Admitting: Neurology

## 2021-07-03 NOTE — Telephone Encounter (Signed)
Message from Dr. Jaynee Eagles:  Tori/Jillian: Would one of you be able to call my 2pm video appointment on Wednesday 9/21 and reschedule to an NP? She has not complied with my recommendations so I suspect she would be a no-show anyway but please call and reshedule her to an NP video call 15 or 30 minutes.  I called patient to reschedule and she did not have a voice mail and she does not have mychart. I cancelled appointment.

## 2021-07-05 ENCOUNTER — Telehealth: Payer: Managed Care, Other (non HMO) | Admitting: Neurology

## 2022-01-26 ENCOUNTER — Emergency Department (HOSPITAL_BASED_OUTPATIENT_CLINIC_OR_DEPARTMENT_OTHER)
Admission: EM | Admit: 2022-01-26 | Discharge: 2022-01-26 | Disposition: A | Discharge status: Home / Self Care | Payer: Managed Care, Other (non HMO) | Attending: Emergency Medicine | Admitting: Emergency Medicine

## 2022-01-26 ENCOUNTER — Encounter (HOSPITAL_BASED_OUTPATIENT_CLINIC_OR_DEPARTMENT_OTHER): Payer: Self-pay | Admitting: Emergency Medicine

## 2022-01-26 ENCOUNTER — Other Ambulatory Visit: Payer: Self-pay

## 2022-01-26 DIAGNOSIS — Z7984 Long term (current) use of oral hypoglycemic drugs: Secondary | ICD-10-CM | POA: Insufficient documentation

## 2022-01-26 DIAGNOSIS — R21 Rash and other nonspecific skin eruption: Secondary | ICD-10-CM | POA: Diagnosis present

## 2022-01-26 DIAGNOSIS — L03313 Cellulitis of chest wall: Secondary | ICD-10-CM | POA: Insufficient documentation

## 2022-01-26 DIAGNOSIS — R000 Tachycardia, unspecified: Secondary | ICD-10-CM | POA: Insufficient documentation

## 2022-01-26 MED ORDER — CEPHALEXIN 500 MG PO CAPS: 500.0000 mg | ORAL_CAPSULE | Freq: Four times a day (QID) | ORAL | 0 refills | Status: DC

## 2022-01-26 NOTE — ED Triage Notes (Signed)
Rash to right side this week, has gotten worse. Area started as a single bump and has spread with redness and pain.  ?

## 2022-01-26 NOTE — ED Provider Notes (Signed)
?[facility] ?Provider Note ? ? ?CSN: [id number] ?Arrival date & time: 01/26/22  0740 ? ?  ? ?History ? ?Chief Complaint  ?Patient presents with  ? Rash  ? ? ?Yvette Perry is a 46 y.o. female.  She is here with a complaint of a rash that started on her right lateral chest couple of days ago.  Seems to be spreading.  Painful tender.  Worse with her bra over the area.  No known trauma.  No prior history of same.  She went to urgent care and they told her she needed to come to the emergency department.  She is prediabetic. ? ?The history is provided by the patient.  ?Rash ?Location:  Torso ?Torso rash location:  R chest ?Quality: painful, redness and swelling   ?Pain details:  ?  Quality:  Aching ?  Severity:  Severe ?  Onset quality:  Gradual ?  Duration:  2 days ?  Timing:  Constant ?  Progression:  Worsening ?Chronicity:  New ?Relieved by:  None tried ?Worsened by:  Contact ?Ineffective treatments:  None tried ?Associated symptoms: no abdominal pain, no diarrhea and not vomiting   ? ?  ? ?Home Medications ?Prior to Admission medications   ?Medication Sig Start Date End Date Taking? Authorizing Provider  ?butalbital-acetaminophen-caffeine (FIORICET WITH CODEINE) 50-325-40-30 MG capsule Take 1 capsule by mouth every 4 (four) hours as needed for headache.   Yes [provider]  ?ferrous sulfate 325 (65 FE) MG tablet Take 325 mg by mouth 3 (three) times daily with meals.   Yes [provider]  ?fexofenadine (ALLEGRA) 180 MG tablet Take 180 mg by mouth daily.   Yes [provider]  ?fluticasone (FLONASE) 50 MCG/ACT nasal spray Place 1 spray into the nose daily.    Yes [provider]  ?fluticasone-salmeterol (ADVAIR) 500-50 MCG/ACT AEPB Inhale 1 puff into the lungs in the morning and at bedtime.   Yes [provider]  ?Ipratropium-Albuterol (COMBIVENT RESPIMAT) 20-100 MCG/ACT AERS respimat Inhale 2 puffs into the lungs every 6 (six) hours as  needed. 11/26/17  Yes [provider]  ?megestrol (MEGACE) 40 MG tablet Take 40 mg by mouth. Takes if needed r/t cycle   Yes [provider]  ?metFORMIN (GLUCOPHAGE-XR) 500 MG 24 hr tablet Take 1,000 mg by mouth in the morning and at bedtime.   Yes [provider]  ?montelukast (SINGULAIR) 10 MG tablet Take 10 mg by mouth at bedtime.   Yes [provider]  ?pantoprazole (PROTONIX) 40 MG tablet Take 40 mg by mouth daily.   Yes [provider]  ?prochlorperazine (COMPAZINE) 10 MG tablet Take 10 mg by mouth every 6 (six) hours as needed for nausea or vomiting.   Yes [provider]  ?rizatriptan (MAXALT-MLT) 10 MG disintegrating tablet Take 1 tablet (10 mg total) by mouth as needed for migraine. May repeat in 2 hours if needed 04/04/21  Yes Melvenia Beam, MD  ?albuterol (PROVENTIL HFA;VENTOLIN HFA) 108 (90 BASE) MCG/ACT inhaler Inhale 2 puffs into the lungs every 6 (six) hours as needed. For shortness of breath     [provider]  ?ascorbic acid (VITAMIN C) 500 MG tablet Take 500 mg by mouth 3 (three) times daily. With rose hips    [provider]  ?B Complex-C-Biotin-D-Zinc-FA (VITAL-D RX PO) Take 1,000 mg by mouth daily.    [provider]  ?cholecalciferol (VITAMIN D3) 25 MCG (1000 UT) tablet Take 1,000 Units by  mouth daily.    [provider]  ?EPINEPHrine (EPIPEN 2-PAK) 0.3 mg/0.3 mL IJ SOAJ injection Inject 0.3 mLs (0.3 mg total) into the muscle once as needed (allergic reaction). 05/12/16   Everlene Balls, MD  ?gabapentin (NEURONTIN) 300 MG capsule Take by mouth 2 (two) times daily. 01/18/22   [provider]  ?ibuprofen (ADVIL,MOTRIN) 800 MG tablet Take 1 tablet (800 mg total) by mouth every 8 (eight) hours as needed for cramping. 09/07/18   Manya Silvas, [place]  ?ipratropium (ATROVENT) 0.03 % nasal spray Place 2 sprays into both nostrils 3 (three) times daily.    [provider]   ?lisinopril-hydrochlorothiazide (PRINZIDE,ZESTORETIC) 20-12.5 MG per tablet Take 1 tablet by mouth daily.      [provider]  ?metroNIDAZOLE (FLAGYL) 500 MG tablet Take 1 tablet (500 mg total) by mouth 2 (two) times daily. 10/30/18   Lavonia Drafts, MD  ?ondansetron (ZOFRAN-ODT) 4 MG disintegrating tablet Take 1-2 tablets (4-8 mg total) by mouth every 8 (eight) hours as needed. 04/04/21   Melvenia Beam, MD  ?topiramate (TOPAMAX) 50 MG tablet Start with one pill at bedtime('50mg'$ ). In 2 weeks, if no side effects, increase to 2 pills ('100mg'$ ) at bedtime 04/04/21   Melvenia Beam, MD  ?   ? ?Allergies    ?Patient has no known allergies.   ? ?Review of Systems   ?Review of Systems  ?Gastrointestinal:  Negative for abdominal pain, diarrhea and vomiting.  ?Skin:  Positive for rash.  ? ?Physical Exam ?Updated Vital Signs ?BP (!) 146/96 (BP Location: Right Wrist)   Pulse (!) 117   Temp 99.4 ?F (37.4 ?C)   Resp (!) 22   Ht '5\' 6"'$  (1.676 m)   Wt (!) 140.6 kg   SpO2 95%   BMI 50.04 kg/m?  ?Physical Exam ?Constitutional:   ?   Appearance: Normal appearance. She is well-developed. She is obese.  ?HENT:  ?   Head: Normocephalic and atraumatic.  ?Eyes:  ?   Conjunctiva/sclera: Conjunctivae normal.  ?Cardiovascular:  ?   Rate and Rhythm: Regular rhythm. Tachycardia present.  ?Pulmonary:  ?   Effort: Pulmonary effort is normal.  ?   Breath sounds: Normal breath sounds.  ?Chest:  ?   Comments: Patient has an area of her right lateral chest approximately 15 cm x 10 cm of indurated skin with small pustule.  It is erythematous warm.  No fluctuance.  No blistering.  More consistent with cellulitis than shingles. ?Musculoskeletal:  ?   Cervical back: Neck supple.  ?Skin: ?   General: Skin is warm and dry.  ?Neurological:  ?   General: No focal deficit present.  ?   Mental Status: She is alert.  ?   GCS: GCS eye subscore is 4. GCS verbal subscore is 5. GCS motor subscore is 6.  ? ? ?ED Results / Procedures /  Treatments   ?Labs ?(all labs ordered are listed, but only abnormal results are displayed) ?Labs Reviewed - No data to display ? ?EKG ?None ? ?Radiology ?No results found. ? ?Procedures ?Procedures  ? ? ?Medications Ordered in ED ?Medications - No data to display ? ?ED Course/ Medical Decision Making/ A&P ?  ?                        ?Medical Decision Making ?Risk ?Prescription drug management. ? ?Differential diagnosis includes rash, cellulitis, shingles.  No evidence of abscess.  Patient does have a few pustules but  no vesicles.  Will cover with antibiotics for likely cellulitis.  Return instructions discussed ? ? ? ? ? ? ? ? ?Final Clinical Impression(s) / ED Diagnoses ?Final diagnoses:  ?Cellulitis of chest wall  ? ? ?Rx / DC Orders ?ED Discharge Orders   ? ?      Ordered  ?  cephALEXin (KEFLEX) 500 MG capsule  4 times daily       ? 01/26/22 0820  ? ?  ?  ? ?  ? ? ?  ?Hayden Rasmussen, MD ?01/26/22 1827 ? ?

## 2022-01-26 NOTE — Discharge Instructions (Addendum)
You were seen in the emergency department for a rash on your right lateral chest.  This is likely cellulitis or skin infection.  We are starting you on some oral antibiotics.  Please use loose clothing to the affected area.  Tylenol and ibuprofen for pain.  Follow-up with your primary care doctor.  Return to the emergency department if any worsening or concerning symptoms. ?

## 2022-01-30 ENCOUNTER — Emergency Department (HOSPITAL_BASED_OUTPATIENT_CLINIC_OR_DEPARTMENT_OTHER): Payer: Managed Care, Other (non HMO)

## 2022-01-30 ENCOUNTER — Inpatient Hospital Stay (HOSPITAL_BASED_OUTPATIENT_CLINIC_OR_DEPARTMENT_OTHER)
Admission: EM | Admit: 2022-01-30 | Discharge: 2022-02-05 | DRG: 603 | Disposition: A | Discharge status: Home / Self Care | Payer: Managed Care, Other (non HMO) | Attending: Internal Medicine | Admitting: Internal Medicine

## 2022-01-30 ENCOUNTER — Other Ambulatory Visit: Payer: Self-pay

## 2022-01-30 DIAGNOSIS — G43909 Migraine, unspecified, not intractable, without status migrainosus: Secondary | ICD-10-CM | POA: Diagnosis present

## 2022-01-30 DIAGNOSIS — Z7984 Long term (current) use of oral hypoglycemic drugs: Secondary | ICD-10-CM

## 2022-01-30 DIAGNOSIS — Z9851 Tubal ligation status: Secondary | ICD-10-CM

## 2022-01-30 DIAGNOSIS — Z8249 Family history of ischemic heart disease and other diseases of the circulatory system: Secondary | ICD-10-CM

## 2022-01-30 DIAGNOSIS — I10 Essential (primary) hypertension: Secondary | ICD-10-CM | POA: Diagnosis present

## 2022-01-30 DIAGNOSIS — L02213 Cutaneous abscess of chest wall: Principal | ICD-10-CM | POA: Diagnosis present

## 2022-01-30 DIAGNOSIS — E119 Type 2 diabetes mellitus without complications: Secondary | ICD-10-CM | POA: Diagnosis present

## 2022-01-30 DIAGNOSIS — L0390 Cellulitis, unspecified: Secondary | ICD-10-CM | POA: Diagnosis present

## 2022-01-30 DIAGNOSIS — J45909 Unspecified asthma, uncomplicated: Secondary | ICD-10-CM | POA: Diagnosis present

## 2022-01-30 DIAGNOSIS — D649 Anemia, unspecified: Secondary | ICD-10-CM | POA: Diagnosis present

## 2022-01-30 DIAGNOSIS — Z79899 Other long term (current) drug therapy: Secondary | ICD-10-CM

## 2022-01-30 DIAGNOSIS — Z6841 Body Mass Index (BMI) 40.0 and over, adult: Secondary | ICD-10-CM

## 2022-01-30 DIAGNOSIS — L03313 Cellulitis of chest wall: Secondary | ICD-10-CM | POA: Diagnosis present

## 2022-01-30 DIAGNOSIS — Z833 Family history of diabetes mellitus: Secondary | ICD-10-CM

## 2022-01-30 DIAGNOSIS — E876 Hypokalemia: Secondary | ICD-10-CM | POA: Diagnosis present

## 2022-01-30 LAB — COMPREHENSIVE METABOLIC PANEL
ALT: 14 U/L (ref 0–44)
AST: 12 U/L — ABNORMAL LOW (ref 15–41)
Albumin: 3 g/dL — ABNORMAL LOW (ref 3.5–5.0)
Alkaline Phosphatase: 96 U/L (ref 38–126)
Anion gap: 10 (ref 5–15)
BUN: 22 mg/dL — ABNORMAL HIGH (ref 6–20)
CO2: 27 mmol/L (ref 22–32)
Calcium: 8.7 mg/dL — ABNORMAL LOW (ref 8.9–10.3)
Chloride: 100 mmol/L (ref 98–111)
Creatinine, Ser: 0.99 mg/dL (ref 0.44–1.00)
GFR, Estimated: >60 mL/min (ref >60)
Glucose, Bld: 190 mg/dL — ABNORMAL HIGH (ref 70–99)
Potassium: 3.1 mmol/L — ABNORMAL LOW (ref 3.5–5.1)
Sodium: 137 mmol/L (ref 135–145)
Total Bilirubin: 0.5 mg/dL (ref 0.3–1.2)
Total Protein: 7.2 g/dL (ref 6.5–8.1)

## 2022-01-30 LAB — CBC WITH DIFFERENTIAL/PLATELET
Abs Immature Granulocytes: 0.06 10*3/uL (ref 0.00–0.07)
Basophils Absolute: 0 10*3/uL (ref 0.0–0.1)
Basophils Relative: 0 %
Eosinophils Absolute: 0.8 10*3/uL — ABNORMAL HIGH (ref 0.0–0.5)
Eosinophils Relative: 9 %
HCT: 35.5 % — ABNORMAL LOW (ref 36.0–46.0)
Hemoglobin: 10.9 g/dL — ABNORMAL LOW (ref 12.0–15.0)
Immature Granulocytes: 1 %
Lymphocytes Relative: 14 %
Lymphs Abs: 1.3 10*3/uL (ref 0.7–4.0)
MCH: 25.1 pg — ABNORMAL LOW (ref 26.0–34.0)
MCHC: 30.7 g/dL (ref 30.0–36.0)
MCV: 81.6 fL (ref 80.0–100.0)
Monocytes Absolute: 0.7 10*3/uL (ref 0.1–1.0)
Monocytes Relative: 8 %
Neutro Abs: 6.7 10*3/uL (ref 1.7–7.7)
Neutrophils Relative %: 68 %
Platelets: 321 10*3/uL (ref 150–400)
RBC: 4.35 MIL/uL (ref 3.87–5.11)
RDW: 16.8 % — ABNORMAL HIGH (ref 11.5–15.5)
WBC: 9.7 10*3/uL (ref 4.0–10.5)
nRBC: 0 % (ref 0.0–0.2)

## 2022-01-30 LAB — HCG, QUANTITATIVE, PREGNANCY: hCG, Beta Chain, Quant, S: <1 m[IU]/mL (ref <5)

## 2022-01-30 MED ORDER — IOHEXOL 300 MG/ML  SOLN
100.0000 mL | Freq: Once | INTRAMUSCULAR | Status: AC | PRN
  Administered 2022-01-30: 100 mL via INTRAVENOUS

## 2022-01-30 MED ORDER — CEFTRIAXONE SODIUM 2 G IJ SOLR
2.0000 g | Freq: Once | INTRAMUSCULAR | Status: AC
  Administered 2022-01-30: 2 g via INTRAVENOUS
  Filled 2022-01-30: qty 20

## 2022-01-30 MED ORDER — MORPHINE SULFATE (PF) 4 MG/ML IV SOLN
4.0000 mg | Freq: Once | INTRAVENOUS | Status: AC
  Administered 2022-01-31: 4 mg via INTRAVENOUS
  Filled 2022-01-30: qty 1

## 2022-01-30 NOTE — ED Triage Notes (Signed)
Pt presents with abscess on L side. Was on antibiotics but not I and D. She reports getting worse- still not draining.  ?

## 2022-01-30 NOTE — ED Provider Notes (Signed)
?[facility]PARTMENT ?Provider Note ? ? ?CSN: [id number] ?Arrival date & time: 01/30/22  1849 ? ?  ? ?History ?Chief Complaint  ?Patient presents with  ? Skin Problem  ? ? ?Yvette Perry is a 46 y.o. female with history of asthma, diabetes, hypertension, presents emergency department for evaluation of worsening abscess on her right flank/chest for the past 5-6 days.  Patient reports that she was seen in the emergency department and was placed on Keflex.  She is almost completed her antibiotic course and reports that her pain and swelling has worsened.  She also notes that she has been febrile, but has been managing it with ibuprofen and Tylenol.  She reports her Tmax was 101F on Thursday when the symptoms presented.  She denies any discharge from the wound.  She denies any chest pain or shortness of breath.  Denies abdominal pain, nausea, or vomiting. ? ?HPI ? ?  ? ?Home Medications ?Prior to Admission medications   ?Medication Sig Start Date End Date Taking? Authorizing Provider  ?albuterol (PROVENTIL HFA;VENTOLIN HFA) 108 (90 BASE) MCG/ACT inhaler Inhale 2 puffs into the lungs every 6 (six) hours as needed. For shortness of breath     [provider]  ?ascorbic acid (VITAMIN C) 500 MG tablet Take 500 mg by mouth 3 (three) times daily. With rose hips    [provider]  ?B Complex-C-Biotin-D-Zinc-FA (VITAL-D RX PO) Take 1,000 mg by mouth daily.    [provider]  ?butalbital-acetaminophen-caffeine (FIORICET WITH CODEINE) 50-325-40-30 MG capsule Take 1 capsule by mouth every 4 (four) hours as needed for headache.    [provider]  ?cephALEXin (KEFLEX) 500 MG capsule Take 1 capsule (500 mg total) by mouth 4 (four) times daily. 01/26/22   Hayden Rasmussen, MD  ?cholecalciferol (VITAMIN D3) 25 MCG (1000 UT) tablet Take 1,000 Units by mouth daily.    [provider]  ?EPINEPHrine (EPIPEN 2-PAK) 0.3 mg/0.3 mL IJ SOAJ injection Inject 0.3 mLs (0.3 mg  total) into the muscle once as needed (allergic reaction). 05/12/16   Everlene Balls, MD  ?ferrous sulfate 325 (65 FE) MG tablet Take 325 mg by mouth 3 (three) times daily with meals.    [provider]  ?fexofenadine (ALLEGRA) 180 MG tablet Take 180 mg by mouth daily.    [provider]  ?fluticasone (FLONASE) 50 MCG/ACT nasal spray Place 1 spray into the nose daily.     [provider]  ?fluticasone-salmeterol (ADVAIR) 500-50 MCG/ACT AEPB Inhale 1 puff into the lungs in the morning and at bedtime.    [provider]  ?gabapentin (NEURONTIN) 300 MG capsule Take by mouth 2 (two) times daily. 01/18/22   [provider]  ?ibuprofen (ADVIL,MOTRIN) 800 MG tablet Take 1 tablet (800 mg total) by mouth every 8 (eight) hours as needed for cramping. 09/07/18   Manya Silvas, [place]  ?ipratropium (ATROVENT) 0.03 % nasal spray Place 2 sprays into both nostrils 3 (three) times daily.    [provider]  ?Ipratropium-Albuterol (COMBIVENT RESPIMAT) 20-100 MCG/ACT AERS respimat Inhale 2 puffs into the lungs every 6 (six) hours as needed. 11/26/17   [provider]  ?lisinopril-hydrochlorothiazide (PRINZIDE,ZESTORETIC) 20-12.5 MG per tablet Take 1 tablet by mouth daily.      [provider]  ?megestrol (MEGACE) 40 MG tablet Take 40 mg by mouth. Takes if needed r/t cycle    [provider]  ?metFORMIN (GLUCOPHAGE-XR) 500 MG 24 hr tablet Take 1,000 mg by mouth  in the morning and at bedtime.    [provider]  ?metroNIDAZOLE (FLAGYL) 500 MG tablet Take 1 tablet (500 mg total) by mouth 2 (two) times daily. 10/30/18   Lavonia Drafts, MD  ?montelukast (SINGULAIR) 10 MG tablet Take 10 mg by mouth at bedtime.    [provider]  ?ondansetron (ZOFRAN-ODT) 4 MG disintegrating tablet Take 1-2 tablets (4-8 mg total) by mouth every 8 (eight) hours as needed. 04/04/21   Melvenia Beam, MD  ?pantoprazole (PROTONIX) 40 MG tablet Take 40 mg by  mouth daily.    [provider]  ?prochlorperazine (COMPAZINE) 10 MG tablet Take 10 mg by mouth every 6 (six) hours as needed for nausea or vomiting.    [provider]  ?rizatriptan (MAXALT-MLT) 10 MG disintegrating tablet Take 1 tablet (10 mg total) by mouth as needed for migraine. May repeat in 2 hours if needed 04/04/21   Melvenia Beam, MD  ?topiramate (TOPAMAX) 50 MG tablet Start with one pill at bedtime('50mg'$ ). In 2 weeks, if no side effects, increase to 2 pills ('100mg'$ ) at bedtime 04/04/21   Melvenia Beam, MD  ?   ? ?Allergies    ?Patient has no known allergies.   ? ?Review of Systems   ?Review of Systems  ?Constitutional:  Positive for fever. Negative for chills.  ?Skin:   ?     Reports an area of swelling and erythema to the right chest  ? ?Physical Exam ?Updated Vital Signs ?BP 138/85 (BP Location: Right Arm)   Pulse 82   Temp 98.2 ?F (36.8 ?C) (Oral)   Resp 18   LMP 01/25/2022   SpO2 98%  ?Physical Exam ?Vitals and nursing note reviewed.  ?Constitutional:   ?   General: She is not in acute distress. ?   Appearance: Normal appearance. She is obese. She is not ill-appearing or toxic-appearing.  ?HENT:  ?   Head: Normocephalic and atraumatic.  ?Eyes:  ?   General: No scleral icterus. ?Cardiovascular:  ?   Rate and Rhythm: Normal rate and regular rhythm.  ?Pulmonary:  ?   Effort: Pulmonary effort is normal. No respiratory distress.  ?Abdominal:  ?   General: Bowel sounds are normal.  ?   Palpations: Abdomen is soft.  ?   Tenderness: There is no abdominal tenderness. There is no guarding or rebound.  ?Musculoskeletal:     ?   General: No deformity.  ?   Cervical back: Normal range of motion.  ?Skin: ?   General: Skin is warm and dry.  ?   Comments: On the right lower chest/upper abdomen lateral to the right breast on the flank, there is approximately 12 cm x 7 cm area of hard induration.  There appears to be a small amount of central fluctuance.  No active draining or central pustule.   Overlying erythema and warmth to the area.  No red streaking noted.  ?Neurological:  ?   General: No focal deficit present.  ?   Mental Status: She is alert. Mental status is at baseline.  ? ? ?ED Results / Procedures / Treatments   ?Labs ?(all labs ordered are listed, but only abnormal results are displayed) ?Labs Reviewed  ?COMPREHENSIVE METABOLIC PANEL - Abnormal; Notable for the following components:  ?    Result Value  ? Potassium 3.1 (*)   ? Glucose, Bld 190 (*)   ? BUN 22 (*)   ? Calcium 8.7 (*)   ? Albumin 3.0 (*)   ?  AST 12 (*)   ? All other components within normal limits  ?CBC WITH DIFFERENTIAL/PLATELET - Abnormal; Notable for the following components:  ? Hemoglobin 10.9 (*)   ? HCT 35.5 (*)   ? MCH 25.1 (*)   ? RDW 16.8 (*)   ? Eosinophils Absolute 0.8 (*)   ? All other components within normal limits  ?HCG, QUANTITATIVE, PREGNANCY  ? ? ?EKG ?None ? ?Radiology ?CT Chest W Contrast ? ?Result Date: 01/30/2022 ?CLINICAL DATA:  Soft tissue mass, chest, deep possible abscess on the right upper flank EXAM: CT CHEST WITH CONTRAST TECHNIQUE: Multidetector CT imaging of the chest was performed during intravenous contrast administration. RADIATION DOSE REDUCTION: This exam was performed according to the departmental dose-optimization program which includes automated exposure control, adjustment of the mA and/or kV according to patient size and/or use of iterative reconstruction technique. CONTRAST:  128m OMNIPAQUE IOHEXOL 300 MG/ML  SOLN COMPARISON:  None. FINDINGS: Cardiovascular: Heart is normal size. Aorta is normal caliber. Mediastinum/Nodes: No mediastinal, hilar, or axillary adenopathy. Trachea and esophagus are unremarkable. Marked heterogeneous and masslike enlargement of the thyroid. Recommend thyroid [place](ref: J Am Coll Radiol. 2015 Feb;12(2): 143-50). Lungs/Pleura: Lungs are clear. No focal airspace opacities or suspicious nodules. No effusions. Upper Abdomen: Low-density lesion in the spleen measuring 2  cm, likely cyst or hemangioma. No acute findings. Musculoskeletal: Within the right lower chest wall and upper abdominal wall laterally there is extensive stranding in the subcutaneous soft tissues. No focal

## 2022-01-31 ENCOUNTER — Encounter (HOSPITAL_COMMUNITY): Payer: Self-pay | Admitting: Internal Medicine

## 2022-01-31 DIAGNOSIS — L03313 Cellulitis of chest wall: Secondary | ICD-10-CM | POA: Diagnosis not present

## 2022-01-31 DIAGNOSIS — E119 Type 2 diabetes mellitus without complications: Secondary | ICD-10-CM

## 2022-01-31 DIAGNOSIS — D649 Anemia, unspecified: Secondary | ICD-10-CM

## 2022-01-31 DIAGNOSIS — E876 Hypokalemia: Secondary | ICD-10-CM

## 2022-01-31 DIAGNOSIS — I10 Essential (primary) hypertension: Secondary | ICD-10-CM

## 2022-01-31 DIAGNOSIS — G43909 Migraine, unspecified, not intractable, without status migrainosus: Secondary | ICD-10-CM

## 2022-01-31 DIAGNOSIS — E66813 Obesity, class 3: Secondary | ICD-10-CM

## 2022-01-31 DIAGNOSIS — L0390 Cellulitis, unspecified: Secondary | ICD-10-CM | POA: Diagnosis present

## 2022-01-31 DIAGNOSIS — J45909 Unspecified asthma, uncomplicated: Secondary | ICD-10-CM

## 2022-01-31 LAB — GLUCOSE, CAPILLARY
Glucose-Capillary: 130 mg/dL — ABNORMAL HIGH (ref 70–99)
Glucose-Capillary: 167 mg/dL — ABNORMAL HIGH (ref 70–99)

## 2022-01-31 LAB — MRSA NEXT GEN BY PCR, NASAL: MRSA by PCR Next Gen: NOT DETECTED

## 2022-01-31 MED ORDER — CEFAZOLIN SODIUM-DEXTROSE 2-4 GM/100ML-% IV SOLN
2.0000 g | Freq: Three times a day (TID) | INTRAVENOUS | Status: DC
  Administered 2022-01-31 – 2022-02-02 (×5): 2 g via INTRAVENOUS
  Filled 2022-01-31 (×6): qty 100

## 2022-01-31 MED ORDER — HYDROCHLOROTHIAZIDE 12.5 MG PO TABS
12.5000 mg | ORAL_TABLET | Freq: Every day | ORAL | Status: DC
  Administered 2022-02-01: 12.5 mg via ORAL
  Filled 2022-01-31 (×2): qty 1

## 2022-01-31 MED ORDER — HYDROCODONE-ACETAMINOPHEN 5-325 MG PO TABS
1.0000 | ORAL_TABLET | Freq: Four times a day (QID) | ORAL | Status: DC | PRN
  Administered 2022-01-31 – 2022-02-05 (×16): 1 via ORAL
  Filled 2022-01-31 (×16): qty 1

## 2022-01-31 MED ORDER — MOMETASONE FURO-FORMOTEROL FUM 200-5 MCG/ACT IN AERO
2.0000 | INHALATION_SPRAY | Freq: Two times a day (BID) | RESPIRATORY_TRACT | Status: DC
  Administered 2022-01-31 – 2022-02-05 (×9): 2 via RESPIRATORY_TRACT
  Filled 2022-01-31: qty 8.8

## 2022-01-31 MED ORDER — ONDANSETRON HCL 4 MG PO TABS: 4.0000 mg | ORAL_TABLET | Freq: Four times a day (QID) | ORAL | Status: DC | PRN

## 2022-01-31 MED ORDER — ONDANSETRON HCL 4 MG/2ML IJ SOLN: 4.0000 mg | Freq: Four times a day (QID) | INTRAMUSCULAR | Status: DC | PRN

## 2022-01-31 MED ORDER — SUMATRIPTAN SUCCINATE 25 MG PO TABS
25.0000 mg | ORAL_TABLET | ORAL | Status: DC | PRN
  Filled 2022-01-31: qty 1

## 2022-01-31 MED ORDER — POTASSIUM CHLORIDE CRYS ER 20 MEQ PO TBCR
40.0000 meq | EXTENDED_RELEASE_TABLET | Freq: Once | ORAL | Status: AC
  Administered 2022-01-31: 40 meq via ORAL
  Filled 2022-01-31: qty 2

## 2022-01-31 MED ORDER — LORATADINE 10 MG PO TABS
10.0000 mg | ORAL_TABLET | Freq: Every day | ORAL | Status: DC
  Administered 2022-01-31 – 2022-02-05 (×6): 10 mg via ORAL
  Filled 2022-01-31 (×6): qty 1

## 2022-01-31 MED ORDER — INSULIN ASPART 100 UNIT/ML IJ SOLN: 0.0000 [IU] | Freq: Every day | INTRAMUSCULAR | Status: DC

## 2022-01-31 MED ORDER — SENNOSIDES-DOCUSATE SODIUM 8.6-50 MG PO TABS
1.0000 | ORAL_TABLET | Freq: Every evening | ORAL | Status: DC | PRN
  Administered 2022-02-04: 1 via ORAL
  Filled 2022-01-31 (×2): qty 1

## 2022-01-31 MED ORDER — INSULIN ASPART 100 UNIT/ML IJ SOLN
0.0000 [IU] | Freq: Three times a day (TID) | INTRAMUSCULAR | Status: DC
  Administered 2022-02-01 – 2022-02-02 (×4): 1 [IU] via SUBCUTANEOUS
  Administered 2022-02-03 (×2): 2 [IU] via SUBCUTANEOUS
  Administered 2022-02-03 – 2022-02-04 (×2): 1 [IU] via SUBCUTANEOUS

## 2022-01-31 MED ORDER — ACETAMINOPHEN 325 MG PO TABS
650.0000 mg | ORAL_TABLET | Freq: Once | ORAL | Status: AC
  Administered 2022-01-31: 650 mg via ORAL
  Filled 2022-01-31: qty 2

## 2022-01-31 MED ORDER — LISINOPRIL-HYDROCHLOROTHIAZIDE 20-12.5 MG PO TABS: 1.0000 | ORAL_TABLET | Freq: Every day | ORAL | Status: DC

## 2022-01-31 MED ORDER — ACETAMINOPHEN 325 MG PO TABS
650.0000 mg | ORAL_TABLET | Freq: Four times a day (QID) | ORAL | Status: DC | PRN
  Administered 2022-02-05: 650 mg via ORAL
  Filled 2022-01-31: qty 2

## 2022-01-31 MED ORDER — BUTALBITAL-APAP-CAFFEINE 50-325-40 MG PO TABS
1.0000 | ORAL_TABLET | ORAL | Status: DC | PRN
  Administered 2022-01-31 – 2022-02-02 (×3): 1 via ORAL
  Filled 2022-01-31 (×3): qty 1

## 2022-01-31 MED ORDER — IPRATROPIUM BROMIDE 0.03 % NA SOLN
2.0000 | Freq: Three times a day (TID) | NASAL | Status: DC
  Administered 2022-01-31 – 2022-02-05 (×13): 2 via NASAL
  Filled 2022-01-31 (×2): qty 30

## 2022-01-31 MED ORDER — GABAPENTIN 300 MG PO CAPS
300.0000 mg | ORAL_CAPSULE | Freq: Three times a day (TID) | ORAL | Status: DC | PRN
  Administered 2022-02-01 – 2022-02-05 (×6): 300 mg via ORAL
  Filled 2022-01-31 (×6): qty 1

## 2022-01-31 MED ORDER — BUTALBITAL-APAP-CAFF-COD 50-325-40-30 MG PO CAPS: 1.0000 | ORAL_CAPSULE | ORAL | Status: DC | PRN

## 2022-01-31 MED ORDER — IPRATROPIUM-ALBUTEROL 0.5-2.5 (3) MG/3ML IN SOLN: 3.0000 mL | Freq: Four times a day (QID) | RESPIRATORY_TRACT | Status: DC | PRN

## 2022-01-31 MED ORDER — ONDANSETRON 4 MG PO TBDP: 4.0000 mg | ORAL_TABLET | Freq: Three times a day (TID) | ORAL | Status: DC | PRN

## 2022-01-31 MED ORDER — IBUPROFEN 400 MG PO TABS
800.0000 mg | ORAL_TABLET | Freq: Three times a day (TID) | ORAL | Status: DC | PRN
  Administered 2022-02-01 – 2022-02-05 (×7): 800 mg via ORAL
  Filled 2022-01-31 (×7): qty 2

## 2022-01-31 MED ORDER — FERROUS SULFATE 325 (65 FE) MG PO TABS
325.0000 mg | ORAL_TABLET | Freq: Two times a day (BID) | ORAL | Status: DC
  Administered 2022-01-31 – 2022-02-05 (×9): 325 mg via ORAL
  Filled 2022-01-31 (×10): qty 1

## 2022-01-31 MED ORDER — LISINOPRIL 20 MG PO TABS
20.0000 mg | ORAL_TABLET | Freq: Every day | ORAL | Status: DC
  Administered 2022-02-01: 20 mg via ORAL
  Filled 2022-01-31: qty 1

## 2022-01-31 MED ORDER — FLUTICASONE PROPIONATE 50 MCG/ACT NA SUSP
1.0000 | Freq: Every day | NASAL | Status: DC
  Administered 2022-01-31 – 2022-02-05 (×6): 1 via NASAL
  Filled 2022-01-31 (×2): qty 16

## 2022-01-31 MED ORDER — IPRATROPIUM-ALBUTEROL 20-100 MCG/ACT IN AERS: 2.0000 | INHALATION_SPRAY | Freq: Four times a day (QID) | RESPIRATORY_TRACT | Status: DC | PRN

## 2022-01-31 MED ORDER — MORPHINE SULFATE (PF) 4 MG/ML IV SOLN
4.0000 mg | INTRAVENOUS | Status: DC | PRN
  Administered 2022-01-31 – 2022-02-02 (×4): 4 mg via INTRAVENOUS
  Filled 2022-01-31 (×4): qty 1

## 2022-01-31 MED ORDER — POTASSIUM CHLORIDE CRYS ER 20 MEQ PO TBCR
30.0000 meq | EXTENDED_RELEASE_TABLET | Freq: Once | ORAL | Status: AC
  Administered 2022-01-31: 30 meq via ORAL
  Filled 2022-01-31: qty 1

## 2022-01-31 MED ORDER — ENOXAPARIN SODIUM 40 MG/0.4ML IJ SOSY
40.0000 mg | PREFILLED_SYRINGE | INTRAMUSCULAR | Status: DC
  Administered 2022-01-31 – 2022-02-01 (×2): 40 mg via SUBCUTANEOUS
  Filled 2022-01-31 (×2): qty 0.4

## 2022-01-31 MED ORDER — MONTELUKAST SODIUM 10 MG PO TABS
10.0000 mg | ORAL_TABLET | Freq: Every day | ORAL | Status: DC
  Administered 2022-01-31 – 2022-02-04 (×5): 10 mg via ORAL
  Filled 2022-01-31 (×6): qty 1

## 2022-01-31 MED ORDER — PANTOPRAZOLE SODIUM 40 MG PO TBEC
40.0000 mg | DELAYED_RELEASE_TABLET | Freq: Every day | ORAL | Status: DC
  Administered 2022-01-31 – 2022-02-05 (×6): 40 mg via ORAL
  Filled 2022-01-31 (×6): qty 1

## 2022-01-31 MED ORDER — ACETAMINOPHEN 650 MG RE SUPP: 650.0000 mg | Freq: Four times a day (QID) | RECTAL | Status: DC | PRN

## 2022-01-31 MED ORDER — PROCHLORPERAZINE MALEATE 10 MG PO TABS
10.0000 mg | ORAL_TABLET | Freq: Four times a day (QID) | ORAL | Status: DC | PRN
  Filled 2022-01-31: qty 1

## 2022-01-31 NOTE — ED Notes (Signed)
Pt provided with oatmeal and drink. Family member given recliner for comfort as they wait for admission bed. NAD noted. States morphine helped with pain. ?

## 2022-01-31 NOTE — H&P (Signed)
?History and Physical  ? ? ?ALEXANDERIA GORBY NTZ:[id number] DOB: 03-31-1976 DOA: 01/30/2022 ? ?PCP: Tempie Hoist, MD  ? ?Patient coming from:  ? ?Chief Complaint  ?Patient presents with  ? Skin Problem  ?   ? ?HPI: Yvette Perry is a 46 y.o. female with medical history significant for hypertension, anemia on iron supplement, diabetes on metformin, asthma, history of migraine, morbid obesity BMI 50 return to the ED with worsening of pain and swelling on the right lateral chest wall.  Was initially seen in the ED on 4/14-for a rash that started on her right lateral chest  and had spread and was painful  w/o known trauma- and on exam had had 15 x 10 cm of indurated skin with a small pustule erythematous no fluctuance no blistering, diagnosed as Cellulitis, discharged on oral Keflex.  Patient had worsening of her swelling pain also had fever of 101 on last Thursday.  In the ED noted to be 12 x 7 cm area of hard induration with a small amount of central fluctuance, no active draining or central pustule,had overlying erythema and warmth. ? ?ED Course: Hemodynamically stable afebrile saturating on room air, lab work showed hypokalemia 3.1 stable CBC with anemia hemoglobin 10.9 g beta-hCG less than 1. ?CT chest w/ contrast:' Stranding within the subcutaneous soft tissues of the right lateral lower chest and upper abdominal wall which could reflect hematoma or phlegmon/infection. No drainable abscess. No acute cardiopulmonary disease" patient was given Rocephin 2 g, K-Dur 40 mEq, admission requested to [facility] and arrived at 4:30 PM. She has had iv morphine 4 mg x3 already. ? ?Vitals:  ? 01/31/22 0730 01/31/22 0900 01/31/22 1250 01/31/22 1618  ?BP: 132/75 128/74 140/82 (!) 144/91  ?Pulse: 84 90 87 91  ?Resp: '20 18 18 15  '$ ?Temp:    98.1 ?F (36.7 ?C)  ?TempSrc:    Oral  ?SpO2: 96% 99% 100% 98%  ? ?Assessment/Plan ?Principal Problem: ?  Cellulitis ?Active Problems: ?  Migraine ?  Diabetes mellitus ([place]) ?  Asthma ?   Hypertension ?  Obesity, Class III, BMI 40-49.9 (morbid obesity) ([place]) ?  Hypokalemia ?  Normocytic anemia ? ?Cellulitis right lateral chest wall, failed outpatient Keflex: CT chest with no drainable fluid collection, consistent with cellulitis, on exam some induration.Will keep on IV ancef, check mrsa pcr. Cont pain control iv morphine, norco tylenol ? ?Hx of Migraine: with active headache- resume her  home fiorecet with codeine ? ?Diabetes mellitus not on long-term insulin: Add on sliding scale insulin, check hemoglobin A1c hold metformin. Carb controlled diet. ? ?Asthma-not in exacerbation, resume home inhaler-Dulera, as needed inhalers, Singulair ? ?Hypertension: BP is well controlled resume home meds-lisinopril HCTZ ? ?Morbid obesity/Class III: Will benefit with weight loss PC follow-up and healthy lifestyle. ? ?Hypokalemia: Repleted, add  additional potassium chloride and recheck I nam ? ?Anemia hemoglobin at 10 g.  Monitor.  Resume home iron supplement ? ?There is no height or weight on file to calculate BMI.  ? ?Severity of Illness: ?The appropriate patient status for this patient is OBSERVATION. Observation status is judged to be reasonable and necessary in order to provide the required intensity of service to ensure the patient's safety. The patient's presenting symptoms, physical exam findings, and initial radiographic and laboratory data in the context of their medical condition is felt to place them at decreased risk for further clinical deterioration. Furthermore, it is anticipated that the patient will be medically stable for discharge from the  hospital within 2 midnights of admission.   ? ?DVT prophylaxis: enoxaparin (LOVENOX) injection 40 mg Start: 01/31/22 1745 ?Code Status:   Code Status: Full Code  ?Family Communication: Admission, patients condition and plan of care including tests being ordered have been discussed with the patient  who indicate understanding and agree with the plan and Code  Status. ? ?Consults called:  ? ? ?Review of Systems: All systems were reviewed and were negative except as mentioned in HPI above. ?Negative for fever ?Negative for chest pain ?Negative for shortness of breath ? ?Past Medical History:  ?Diagnosis Date  ? Abnormal Pap smear of cervix   ? LGSIL  ? Asthma   ? Diabetes mellitus ([place])   ? Goiter   ? Hypertension   ? Migraine   ? Pre-diabetes   ? Uterine fibroid   ? ? ?Past Surgical History:  ?Procedure Laterality Date  ? CESAREAN SECTION  06/2003  ? COLPOSCOPY  12/22/2008  ? normal  ? ENDOMETRIAL BIOPSY  06/30/2013  ? benign  ? TUBAL LIGATION  06/2003  ? ? ? reports that she has never smoked. She has never used smokeless tobacco. She reports current alcohol use. She reports that she does not use drugs. ? ?No Known Allergies ? ?Family History  ?Problem Relation Age of Onset  ? Hypertension Father   ? Diabetes Father   ? Kidney failure Father   ? Hypertension Mother   ? ? ? ?Prior to Admission medications   ?Medication Sig Start Date End Date Taking? Authorizing Provider  ?ascorbic acid (VITAMIN C) 500 MG tablet Take 500 mg by mouth 3 (three) times daily. With rose hips   Yes [provider]  ?B Complex-C-Biotin-D-Zinc-FA (VITAL-D RX PO) Take 1,000 mg by mouth daily.   Yes [provider]  ?butalbital-acetaminophen-caffeine (FIORICET WITH CODEINE) 50-325-40-30 MG capsule Take 1 capsule by mouth every 4 (four) hours as needed for headache.   Yes [provider]  ?cephALEXin (KEFLEX) 500 MG capsule Take 1 capsule (500 mg total) by mouth 4 (four) times daily. 01/26/22  Yes Hayden Rasmussen, MD  ?cholecalciferol (VITAMIN D3) 25 MCG (1000 UT) tablet Take 1,000 Units by mouth daily.   Yes [provider]  ?diphenhydrAMINE (BENADRYL) 25 MG tablet Take by mouth.   Yes [provider]  ?EPINEPHrine (EPIPEN 2-PAK) 0.3 mg/0.3 mL IJ SOAJ injection Inject 0.3 mLs (0.3 mg total) into the muscle once as needed (allergic reaction). ?Patient  taking differently: Inject 0.3 mg into the muscle once as needed for anaphylaxis (allergic reaction). 05/12/16  Yes Everlene Balls, MD  ?ferrous sulfate 325 (65 FE) MG tablet Take 325 mg by mouth 3 (three) times daily with meals.   Yes [provider]  ?fexofenadine (ALLEGRA) 180 MG tablet Take 180 mg by mouth daily.   Yes [provider]  ?fluticasone (FLONASE) 50 MCG/ACT nasal spray Place 1 spray into the nose daily.    Yes [provider]  ?fluticasone-salmeterol (ADVAIR) 500-50 MCG/ACT AEPB Inhale 1 puff into the lungs in the morning and at bedtime.   Yes [provider]  ?gabapentin (NEURONTIN) 300 MG capsule Take 300 mg by mouth as needed (moderate pain). 01/18/22  Yes [provider]  ?ibuprofen (ADVIL,MOTRIN) 800 MG tablet Take 1 tablet (800 mg total) by mouth every 8 (eight) hours as needed for cramping. 09/07/18  Yes Tamala Julian, [place], [place]  ?ipratropium (ATROVENT) 0.03 % nasal spray Place 2 sprays into both nostrils 3 (three) times daily.   Yes  [provider]  ?Ipratropium-Albuterol (COMBIVENT RESPIMAT) 20-100 MCG/ACT AERS respimat Inhale 2 puffs into the lungs every 6 (six) hours as needed. 11/26/17  Yes [provider]  ?lisinopril-hydrochlorothiazide (PRINZIDE,ZESTORETIC) 20-12.5 MG per tablet Take 1 tablet by mouth daily.     Yes [provider]  ?megestrol (MEGACE) 40 MG tablet Take 40 mg by mouth. Takes if needed r/t cycle   Yes [provider]  ?metFORMIN (GLUCOPHAGE-XR) 500 MG 24 hr tablet Take 1,000 mg by mouth in the morning and at bedtime.   Yes [provider]  ?montelukast (SINGULAIR) 10 MG tablet Take 10 mg by mouth at bedtime.   Yes [provider]  ?ondansetron (ZOFRAN-ODT) 4 MG disintegrating tablet Take 1-2 tablets (4-8 mg total) by mouth every 8 (eight) hours as needed. 04/04/21  Yes Melvenia Beam, MD  ?prochlorperazine (COMPAZINE) 10 MG tablet Take 10 mg by mouth every 6 (six) hours as needed  for nausea or vomiting.   Yes [provider]  ?rizatriptan (MAXALT-MLT) 10 MG disintegrating tablet Take 1 tablet (10 mg total) by mouth as needed for migraine. May repeat in 2 hours if needed 04/04/21

## 2022-02-01 DIAGNOSIS — L02213 Cutaneous abscess of chest wall: Secondary | ICD-10-CM | POA: Diagnosis present

## 2022-02-01 DIAGNOSIS — J45909 Unspecified asthma, uncomplicated: Secondary | ICD-10-CM | POA: Diagnosis present

## 2022-02-01 DIAGNOSIS — E119 Type 2 diabetes mellitus without complications: Secondary | ICD-10-CM | POA: Diagnosis present

## 2022-02-01 DIAGNOSIS — E876 Hypokalemia: Secondary | ICD-10-CM | POA: Diagnosis present

## 2022-02-01 DIAGNOSIS — Z79899 Other long term (current) drug therapy: Secondary | ICD-10-CM | POA: Diagnosis not present

## 2022-02-01 DIAGNOSIS — D649 Anemia, unspecified: Secondary | ICD-10-CM | POA: Diagnosis present

## 2022-02-01 DIAGNOSIS — L03313 Cellulitis of chest wall: Principal | ICD-10-CM | POA: Diagnosis present

## 2022-02-01 DIAGNOSIS — Z7984 Long term (current) use of oral hypoglycemic drugs: Secondary | ICD-10-CM | POA: Diagnosis not present

## 2022-02-01 DIAGNOSIS — I10 Essential (primary) hypertension: Secondary | ICD-10-CM | POA: Diagnosis present

## 2022-02-01 DIAGNOSIS — Z6841 Body Mass Index (BMI) 40.0 and over, adult: Secondary | ICD-10-CM | POA: Diagnosis not present

## 2022-02-01 DIAGNOSIS — Z9851 Tubal ligation status: Secondary | ICD-10-CM | POA: Diagnosis not present

## 2022-02-01 DIAGNOSIS — Z8249 Family history of ischemic heart disease and other diseases of the circulatory system: Secondary | ICD-10-CM | POA: Diagnosis not present

## 2022-02-01 DIAGNOSIS — G43909 Migraine, unspecified, not intractable, without status migrainosus: Secondary | ICD-10-CM | POA: Diagnosis present

## 2022-02-01 DIAGNOSIS — Z833 Family history of diabetes mellitus: Secondary | ICD-10-CM | POA: Diagnosis not present

## 2022-02-01 LAB — BASIC METABOLIC PANEL
Anion gap: 5 (ref 5–15)
BUN: 17 mg/dL (ref 6–20)
CO2: 31 mmol/L (ref 22–32)
Calcium: 8.5 mg/dL — ABNORMAL LOW (ref 8.9–10.3)
Chloride: 101 mmol/L (ref 98–111)
Creatinine, Ser: 0.83 mg/dL (ref 0.44–1.00)
GFR, Estimated: >60 mL/min (ref >60)
Glucose, Bld: 177 mg/dL — ABNORMAL HIGH (ref 70–99)
Potassium: 3.8 mmol/L (ref 3.5–5.1)
Sodium: 137 mmol/L (ref 135–145)

## 2022-02-01 LAB — HEMOGLOBIN A1C
Hgb A1c MFr Bld: 7.4 % — ABNORMAL HIGH (ref 4.8–5.6)
Mean Plasma Glucose: 165.68 mg/dL

## 2022-02-01 LAB — CBC
HCT: 34.3 % — ABNORMAL LOW (ref 36.0–46.0)
Hemoglobin: 10.1 g/dL — ABNORMAL LOW (ref 12.0–15.0)
MCH: 25.3 pg — ABNORMAL LOW (ref 26.0–34.0)
MCHC: 29.4 g/dL — ABNORMAL LOW (ref 30.0–36.0)
MCV: 85.8 fL (ref 80.0–100.0)
Platelets: 291 10*3/uL (ref 150–400)
RBC: 4 MIL/uL (ref 3.87–5.11)
RDW: 16.6 % — ABNORMAL HIGH (ref 11.5–15.5)
WBC: 8.8 10*3/uL (ref 4.0–10.5)
nRBC: 0 % (ref 0.0–0.2)

## 2022-02-01 LAB — GLUCOSE, CAPILLARY
Glucose-Capillary: 144 mg/dL — ABNORMAL HIGH (ref 70–99)
Glucose-Capillary: 125 mg/dL — ABNORMAL HIGH (ref 70–99)
Glucose-Capillary: 121 mg/dL — ABNORMAL HIGH (ref 70–99)
Glucose-Capillary: 134 mg/dL — ABNORMAL HIGH (ref 70–99)

## 2022-02-01 LAB — HIV ANTIBODY (ROUTINE TESTING W REFLEX): HIV Screen 4th Generation wRfx: NONREACTIVE

## 2022-02-01 MED ORDER — SODIUM CHLORIDE 0.9 % IV SOLN
100.0000 mg | Freq: Two times a day (BID) | INTRAVENOUS | Status: DC
  Administered 2022-02-01 – 2022-02-02 (×3): 100 mg via INTRAVENOUS
  Filled 2022-02-01 (×4): qty 100

## 2022-02-01 NOTE — Progress Notes (Signed)
?PROGRESS NOTE ?Yvette Perry  WGY:[id number] DOB: 14-Jun-1976 DOA: 01/30/2022 ?PCP: Tempie Hoist, MD  ? ?Brief Narrative/Hospital Course: ?No notes on file  ?  ?Subjective: ?Seen and examined this morning complains of ongoing redness pain in the right lateral chest wall, with some drainage this am. ? ?Assessment and Plan: ?Principal Problem: ?  Cellulitis ?Active Problems: ?  Migraine ?  Diabetes mellitus ([place]) ?  Asthma ?  Hypertension ?  Obesity, Class III, BMI 40-49.9 (morbid obesity) ([place]) ?  Hypokalemia ?  Normocytic anemia ?  Cellulitis of chest wall ? ?Cellulitis right lateral chest wall, failed outpatient Keflex: CT chest with no drainable fluid collection, consistent with cellulitis, on exam some induration, mild tenderness this morning, continue IV antibiotics-will order culture of the wound.  MRSA PCR negative.Cont pain control iv morphine, norco tylenol ?  ?Hx of Migraine: Continue fiorecet with codeine ?  ?Diabetes mellitus not on long-term insulin: Borderline controlled A1c 7.4, blood sugar well controlled. Cont sliding scale insulin, hold metformin. Carb controlled diet. ?Recent Labs  ?Lab 01/31/22 ?1718 01/31/22 ?2148 02/01/22 ?0427 02/01/22 ?0729  ?GLUCAP 130* 167*  --  144*  ?HGBA1C  --   --  7.4*  --   ?   ?Asthma-not in exacerbation, continue  home inhaler-Dulera, as needed inhalers, Singulair ?  ?Hypertension: BP borderline controlled.  Continue home lisinopril HCTZ ?  ?Morbid obesity/Class III: Will benefit with weight loss PC follow-up and healthy lifestyle. ?  ?Hypokalemia: Resolved.   ?  ?Anemia hemoglobin at 10 g.  Likely chronic, with regular menstruation, she takes Megace as needed for heavy bleeding. Cont home iron supplement ?  ?DVT prophylaxis: enoxaparin (LOVENOX) injection 40 mg Start: 01/31/22 1800 ?Code Status:   Code Status: Full Code ?Family Communication: plan of care discussed with patient at bedside. ?Patient status is: Inpatient level of care: Med-Surg  ?Remains inpatient  because: Ongoing IV antibiotics ?Patient currently not stable ? ?Dispo: The patient is from: home ?           Anticipated disposition: home 2 days ? ?Mobility Assessment (last 72 hours)   ? ? Mobility Assessment   ? ? [place] Name 01/31/22 1913 01/31/22 1700  ?  ?  ?  ? Does patient have an order for bedrest or is patient medically unstable No - Continue assessment No - Continue assessment     ? What is the highest level of mobility based on the progressive mobility assessment? Level 6 (Walks independently in room and hall) - Balance while walking in room without assist - Complete Level 6 (Walks independently in room and hall) - Balance while walking in room without assist - Complete     ? ?  ?  ? ?  ?  ? ?Objective: ?Vitals last 24 hrs: ?Vitals:  ? 02/01/22 0216 02/01/22 0535 02/01/22 0849 02/01/22 0919  ?BP: (!) 141/91 (!) 150/95  (!) 160/108  ?Pulse: 84 74  88  ?Resp: '18 17  18  '$ ?Temp: 98.1 ?F (36.7 ?C) 98.6 ?F (37 ?C)  98.6 ?F (37 ?C)  ?TempSrc:  Oral  Oral  ?SpO2: 98% 100% 97% 99%  ?Weight:      ?Height:      ? ?Weight change:  ? ?Physical Examination: ?General exam: AA,older than stated age, weak appearing. ?HEENT:Oral mucosa moist, Ear/Nose WNL grossly, dentition normal. ?Respiratory system: bilaterally diminished BS, no use of accessory muscle ?Cardiovascular system: S1 & S2 +, No JVD,. ?Gastrointestinal system: Abdomen soft,NT,ND, BS+ ?Nervous System:Alert, awake, moving extremities  and grossly nonfocal ?Extremities: LE edema none,distal peripheral pulses palpable.  ?Skin: No rashes,no icterus. ?MSK: Normal muscle bulk,tone, power ?Right lateral chest wall multiple skin fold, medial and with area of induration mild redness tenderness and mild erythema ? ?Medications reviewed: ?Scheduled Meds: ? enoxaparin (LOVENOX) injection  40 mg Subcutaneous Q24H  ? ferrous sulfate  325 mg Oral BID WC  ? fluticasone  1 spray Each Nare Daily  ? lisinopril  20 mg Oral Daily  ? And  ? hydrochlorothiazide  12.5 mg Oral Daily  ?  insulin aspart  0-5 Units Subcutaneous QHS  ? insulin aspart  0-9 Units Subcutaneous TID WC  ? ipratropium  2 spray Each Nare TID  ? loratadine  10 mg Oral Daily  ? mometasone-formoterol  2 puff Inhalation BID  ? montelukast  10 mg Oral QHS  ? pantoprazole  40 mg Oral Daily  ? ?Continuous Infusions: ?  ceFAZolin (ANCEF) IV 2 g (02/01/22 0547)  ? doxycycline (VIBRAMYCIN) IV    ? ? ?  ?Diet Order   ? ?       ?  Diet Carb Modified Fluid consistency: Thin; Room service appropriate? Yes  Diet effective now       ?  ? ?  ?  ? ?  ?  ? ?  ?  ?  ? ? ?Intake/Output Summary (Last 24 hours) at 02/01/2022 1002 ?Last data filed at 02/01/2022 0500 ?Gross per 24 hour  ?Intake 460 ml  ?Output 300 ml  ?Net 160 ml  ? ?Net IO Since Admission: 160 mL [02/01/22 1002]  ?Wt Readings from Last 3 Encounters:  ?01/31/22 (!) 140.6 kg  ?01/26/22 (!) 140.6 kg  ?04/04/21 135.2 kg  ?  ? ?Unresulted Labs (From admission, onward)  ? ?  Start     Ordered  ? 02/07/22 0500  Creatinine, serum  (enoxaparin (LOVENOX)    CrCl >/= 30 ml/min)  Weekly,   R     ?Comments: while on enoxaparin therapy ?  ?Question:  Specimen collection method  Answer:  Lab=Lab collect  ? 01/31/22 1656  ? 02/01/22 0854  Aerobic Culture w Gram Stain (superficial specimen)  Once,   R       ? 02/01/22 0853  ? ?  ?  ? ?  ?Data Reviewed: I have personally reviewed following labs and imaging studies ?CBC: ?Recent Labs  ?Lab 14-Feb-2022 ?1930 02/01/22 ?0427  ?WBC 9.7 8.8  ?NEUTROABS 6.7  --   ?HGB 10.9* 10.1*  ?HCT 35.5* 34.3*  ?MCV 81.6 85.8  ?PLT 321 291  ? ?Basic Metabolic Panel: ?Recent Labs  ?Lab Feb 14, 2022 ?1930 02/01/22 ?0427  ?NA 137 137  ?K 3.1* 3.8  ?CL 100 101  ?CO2 27 31  ?GLUCOSE 190* 177*  ?BUN 22* 17  ?CREATININE 0.99 0.83  ?CALCIUM 8.7* 8.5*  ? ?GFR: ?Estimated Creatinine Clearance: 122.7 mL/min (by C-G formula based on SCr of 0.83 mg/dL). ?Liver Function Tests: ?Recent Labs  ?Lab Feb 14, 2022 ?1930  ?AST 12*  ?ALT 14  ?ALKPHOS 96  ?BILITOT 0.5  ?PROT 7.2  ?ALBUMIN 3.0*  ? ?No  results for input(s): LIPASE, AMYLASE in the last 168 hours. ?No results for input(s): AMMONIA in the last 168 hours. ?Coagulation Profile: ?No results for input(s): INR, PROTIME in the last 168 hours. ?BNP (last 3 results) ?No results for input(s): PROBNP in the last 8760 hours. ?HbA1C: ?Recent Labs  ?  02/01/22 ?0427  ?HGBA1C 7.4*  ? ?CBG: ?Recent Labs  ?Lab 01/31/22 ?1718  01/31/22 ?2148 02/01/22 ?0729  ?GLUCAP 130* 167* 144*  ? ?Lipid Profile: ?No results for input(s): CHOL, HDL, LDLCALC, TRIG, CHOLHDL, LDLDIRECT in the last 72 hours. ?Thyroid Function Tests: ?No results for input(s): TSH, T4TOTAL, FREET4, T3FREE, THYROIDAB in the last 72 hours. ?Sepsis Labs: ?No results for input(s): PROCALCITON, LATICACIDVEN in the last 168 hours. ? ?Recent Results (from the past 240 hour(s))  ?MRSA Next Gen by PCR, Nasal     Status: None  ? Collection Time: 01/31/22  5:50 PM  ? Specimen: Nasal Mucosa; Nasal Swab  ?Result Value Ref Range Status  ? MRSA by PCR Next Gen NOT DETECTED NOT DETECTED Final  ?  Comment: (NOTE) ?The GeneXpert MRSA Assay (FDA approved for NASAL specimens only), ?is one component of a comprehensive MRSA colonization surveillance ?program. It is not intended to diagnose MRSA infection nor to guide ?or monitor treatment for MRSA infections. ?Test performance is not FDA approved in patients less than 2 years ?old. ?Performed at [facility], [address] ?[place], [place] [address] ?  ?  ?Antimicrobials: ?Anti-infectives (From admission, onward)  ? ? Start     Dose/Rate Route Frequency Ordered Stop  ? 02/01/22 1000  doxycycline (VIBRAMYCIN) 100 mg in sodium chloride 0.9 % 250 mL IVPB       ? 100 mg ?125 mL/hr over 120 Minutes Intravenous Every 12 hours 02/01/22 0854    ? 01/31/22 2200  ceFAZolin (ANCEF) IVPB 2g/100 mL premix       ? 2 g ?200 mL/hr over 30 Minutes Intravenous Every 8 hours 01/31/22 1656 02/07/22 2159  ? 01/30/22 2230  cefTRIAXone (ROCEPHIN) 2 g in sodium chloride 0.9 %  100 mL IVPB       ? 2 g ?200 mL/hr over 30 Minutes Intravenous  Once 01/30/22 2221 01/30/22 2350  ? ?  ? ?Culture/Microbiology ?No results found for: SDES, [place], [place], REPTSTATUS  ?Other culture-see

## 2022-02-01 NOTE — Plan of Care (Signed)
?  Problem: Clinical Measurements: ?Goal: Ability to avoid or minimize complications of infection will improve ?02/01/2022 0125 by Orbie Pyo, RN ?Outcome: Progressing ?02/01/2022 0124 by Orbie Pyo, RN ?Outcome: Progressing ?  ?Problem: Skin Integrity: ?Goal: Skin integrity will improve ?02/01/2022 0125 by Orbie Pyo, RN ?Outcome: Progressing ?02/01/2022 0124 by Orbie Pyo, RN ?Outcome: Progressing ?  ?Problem: Education: ?Goal: Knowledge of General Education information will improve ?Description: Including pain rating scale, medication(s)/side effects and non-pharmacologic comfort measures ?Outcome: Progressing ?  ?Problem: Health Behavior/Discharge Planning: ?Goal: Ability to manage health-related needs will improve ?Outcome: Progressing ?  ?Problem: Clinical Measurements: ?Goal: Ability to maintain clinical measurements within normal limits will improve ?Outcome: Progressing ?Goal: Will remain free from infection ?Outcome: Progressing ?Goal: Diagnostic test results will improve ?Outcome: Progressing ?Goal: Respiratory complications will improve ?Outcome: Progressing ?Goal: Cardiovascular complication will be avoided ?Outcome: Progressing ?  ?Problem: Activity: ?Goal: Risk for activity intolerance will decrease ?Outcome: Progressing ?  ?Problem: Nutrition: ?Goal: Adequate nutrition will be maintained ?Outcome: Progressing ?  ?Problem: Coping: ?Goal: Level of anxiety will decrease ?Outcome: Progressing ?  ?Problem: Elimination: ?Goal: Will not experience complications related to bowel motility ?Outcome: Progressing ?Goal: Will not experience complications related to urinary retention ?Outcome: Progressing ?  ?Problem: Pain Managment: ?Goal: General experience of comfort will improve ?Outcome: Progressing ?  ?Problem: Safety: ?Goal: Ability to remain free from injury will improve ?Outcome: Progressing ?  ?Problem: Skin Integrity: ?Goal: Risk for impaired skin integrity will decrease ?Outcome:  Progressing ?  ?

## 2022-02-01 NOTE — Progress Notes (Signed)
Transition of Care (TOC) Screening Note ? ?Patient Details  ?Name: Yvette Perry ?Date of Birth: [birth year] ? ?Transition of Care (TOC) CM/SW Contact:    ?Sherie Don, LCSW ?Phone Number: ?02/01/2022, 10:32 AM ? ?Transition of Care Department [facility]) has reviewed patient and no TOC needs have been identified at this time. We will continue to monitor patient advancement through interdisciplinary progression rounds. If new patient transition needs arise, please place a TOC consult. ?

## 2022-02-02 ENCOUNTER — Inpatient Hospital Stay (HOSPITAL_COMMUNITY): Payer: Managed Care, Other (non HMO) | Admitting: Certified Registered Nurse Anesthetist

## 2022-02-02 ENCOUNTER — Other Ambulatory Visit: Payer: Self-pay

## 2022-02-02 ENCOUNTER — Encounter (HOSPITAL_COMMUNITY)
Admission: EM | Disposition: A | Discharge status: Home / Self Care | Payer: Self-pay | Source: Home / Self Care | Attending: Internal Medicine

## 2022-02-02 DIAGNOSIS — L02213 Cutaneous abscess of chest wall: Secondary | ICD-10-CM

## 2022-02-02 DIAGNOSIS — I10 Essential (primary) hypertension: Secondary | ICD-10-CM

## 2022-02-02 DIAGNOSIS — L03313 Cellulitis of chest wall: Secondary | ICD-10-CM | POA: Diagnosis not present

## 2022-02-02 DIAGNOSIS — E119 Type 2 diabetes mellitus without complications: Secondary | ICD-10-CM

## 2022-02-02 DIAGNOSIS — Z7984 Long term (current) use of oral hypoglycemic drugs: Secondary | ICD-10-CM

## 2022-02-02 HISTORY — PX: INCISION AND DRAINAGE ABSCESS: SHX5864

## 2022-02-02 LAB — GLUCOSE, CAPILLARY
Glucose-Capillary: 120 mg/dL — ABNORMAL HIGH (ref 70–99)
Glucose-Capillary: 130 mg/dL — ABNORMAL HIGH (ref 70–99)
Glucose-Capillary: 128 mg/dL — ABNORMAL HIGH (ref 70–99)
Glucose-Capillary: 156 mg/dL — ABNORMAL HIGH (ref 70–99)

## 2022-02-02 LAB — CBC
HCT: 32.7 % — ABNORMAL LOW (ref 36.0–46.0)
Hemoglobin: 9.7 g/dL — ABNORMAL LOW (ref 12.0–15.0)
MCH: 24.9 pg — ABNORMAL LOW (ref 26.0–34.0)
MCHC: 29.7 g/dL — ABNORMAL LOW (ref 30.0–36.0)
MCV: 84.1 fL (ref 80.0–100.0)
Platelets: 295 10*3/uL (ref 150–400)
RBC: 3.89 MIL/uL (ref 3.87–5.11)
RDW: 16.5 % — ABNORMAL HIGH (ref 11.5–15.5)
WBC: 9.3 10*3/uL (ref 4.0–10.5)
nRBC: 0 % (ref 0.0–0.2)

## 2022-02-02 LAB — BASIC METABOLIC PANEL
Anion gap: 8 (ref 5–15)
BUN: 16 mg/dL (ref 6–20)
CO2: 27 mmol/L (ref 22–32)
Calcium: 8.5 mg/dL — ABNORMAL LOW (ref 8.9–10.3)
Chloride: 104 mmol/L (ref 98–111)
Creatinine, Ser: 0.79 mg/dL (ref 0.44–1.00)
GFR, Estimated: >60 mL/min (ref >60)
Glucose, Bld: 189 mg/dL — ABNORMAL HIGH (ref 70–99)
Potassium: 3.9 mmol/L (ref 3.5–5.1)
Sodium: 139 mmol/L (ref 135–145)

## 2022-02-02 SURGERY — INCISION AND DRAINAGE, ABSCESS
Anesthesia: General | Laterality: Right

## 2022-02-02 MED ORDER — LIDOCAINE 2% (20 MG/ML) 5 ML SYRINGE
INTRAMUSCULAR | Status: DC | PRN
  Administered 2022-02-02: 60 mg via INTRAVENOUS

## 2022-02-02 MED ORDER — OXYCODONE HCL 5 MG/5ML PO SOLN: 5.0000 mg | Freq: Once | ORAL | Status: DC | PRN

## 2022-02-02 MED ORDER — ACETAMINOPHEN 500 MG PO TABS
1000.0000 mg | ORAL_TABLET | Freq: Once | ORAL | Status: AC
  Administered 2022-02-02: 1000 mg via ORAL
  Filled 2022-02-02: qty 2

## 2022-02-02 MED ORDER — CELECOXIB 200 MG PO CAPS
200.0000 mg | ORAL_CAPSULE | Freq: Once | ORAL | Status: AC
  Administered 2022-02-02: 200 mg via ORAL

## 2022-02-02 MED ORDER — VANCOMYCIN HCL 1250 MG/250ML IV SOLN
1250.0000 mg | Freq: Two times a day (BID) | INTRAVENOUS | Status: DC
  Administered 2022-02-03 – 2022-02-05 (×5): 1250 mg via INTRAVENOUS
  Filled 2022-02-02 (×6): qty 250

## 2022-02-02 MED ORDER — DEXAMETHASONE SODIUM PHOSPHATE 10 MG/ML IJ SOLN
INTRAMUSCULAR | Status: AC
  Filled 2022-02-02: qty 1

## 2022-02-02 MED ORDER — BUPIVACAINE-EPINEPHRINE (PF) 0.25% -1:200000 IJ SOLN
INTRAMUSCULAR | Status: AC
  Filled 2022-02-02: qty 30

## 2022-02-02 MED ORDER — SODIUM CHLORIDE 0.9 % IV SOLN
2.0000 g | Freq: Every day | INTRAVENOUS | Status: DC
  Administered 2022-02-02 – 2022-02-05 (×4): 2 g via INTRAVENOUS
  Filled 2022-02-02 (×4): qty 20

## 2022-02-02 MED ORDER — PROPOFOL 10 MG/ML IV BOLUS
INTRAVENOUS | Status: AC
  Filled 2022-02-02: qty 20

## 2022-02-02 MED ORDER — FENTANYL CITRATE PF 50 MCG/ML IJ SOSY
25.0000 ug | PREFILLED_SYRINGE | INTRAMUSCULAR | Status: DC | PRN
  Administered 2022-02-02: 50 ug via INTRAVENOUS

## 2022-02-02 MED ORDER — BUPIVACAINE-EPINEPHRINE 0.25% -1:200000 IJ SOLN
INTRAMUSCULAR | Status: DC | PRN
  Administered 2022-02-02: 10 mL

## 2022-02-02 MED ORDER — FENTANYL CITRATE (PF) 100 MCG/2ML IJ SOLN
INTRAMUSCULAR | Status: AC
  Filled 2022-02-02: qty 2

## 2022-02-02 MED ORDER — MIDAZOLAM HCL 2 MG/2ML IJ SOLN
INTRAMUSCULAR | Status: AC
  Filled 2022-02-02: qty 2

## 2022-02-02 MED ORDER — LIDOCAINE HCL (PF) 2 % IJ SOLN
INTRAMUSCULAR | Status: AC
  Filled 2022-02-02: qty 5

## 2022-02-02 MED ORDER — LISINOPRIL 20 MG PO TABS
40.0000 mg | ORAL_TABLET | Freq: Every day | ORAL | Status: DC
  Administered 2022-02-02 – 2022-02-05 (×4): 40 mg via ORAL
  Filled 2022-02-02 (×4): qty 2

## 2022-02-02 MED ORDER — LACTATED RINGERS IV SOLN: INTRAVENOUS | Status: DC

## 2022-02-02 MED ORDER — DIPHENHYDRAMINE HCL 50 MG/ML IJ SOLN
12.5000 mg | Freq: Four times a day (QID) | INTRAMUSCULAR | Status: DC | PRN
  Administered 2022-02-02: 12.5 mg via INTRAVENOUS
  Filled 2022-02-02: qty 1

## 2022-02-02 MED ORDER — ONDANSETRON HCL 4 MG/2ML IJ SOLN: 4.0000 mg | Freq: Once | INTRAMUSCULAR | Status: DC | PRN

## 2022-02-02 MED ORDER — ONDANSETRON HCL 4 MG/2ML IJ SOLN
INTRAMUSCULAR | Status: AC
  Filled 2022-02-02: qty 2

## 2022-02-02 MED ORDER — CELECOXIB 200 MG PO CAPS
ORAL_CAPSULE | ORAL | Status: AC
  Filled 2022-02-02: qty 1

## 2022-02-02 MED ORDER — MIDAZOLAM HCL 5 MG/5ML IJ SOLN
INTRAMUSCULAR | Status: DC | PRN
Start: 2022-02-02 — End: 2022-02-02
  Administered 2022-02-02 (×2): 2 mg via INTRAVENOUS

## 2022-02-02 MED ORDER — SUCCINYLCHOLINE CHLORIDE 200 MG/10ML IV SOSY
PREFILLED_SYRINGE | INTRAVENOUS | Status: DC | PRN
  Administered 2022-02-02: 140 mg via INTRAVENOUS

## 2022-02-02 MED ORDER — MORPHINE SULFATE (PF) 2 MG/ML IV SOLN
1.0000 mg | INTRAVENOUS | Status: DC | PRN
  Administered 2022-02-02 – 2022-02-03 (×2): 2 mg via INTRAVENOUS
  Filled 2022-02-02 (×2): qty 1

## 2022-02-02 MED ORDER — VANCOMYCIN HCL 2000 MG/400ML IV SOLN
2000.0000 mg | Freq: Once | INTRAVENOUS | Status: AC
  Administered 2022-02-02: 2000 mg via INTRAVENOUS
  Filled 2022-02-02: qty 400

## 2022-02-02 MED ORDER — FENTANYL CITRATE PF 50 MCG/ML IJ SOSY
PREFILLED_SYRINGE | INTRAMUSCULAR | Status: AC
  Filled 2022-02-02: qty 1

## 2022-02-02 MED ORDER — OXYCODONE HCL 5 MG PO TABS: 5.0000 mg | ORAL_TABLET | Freq: Once | ORAL | Status: DC | PRN

## 2022-02-02 MED ORDER — ROCURONIUM BROMIDE 10 MG/ML (PF) SYRINGE
PREFILLED_SYRINGE | INTRAVENOUS | Status: DC | PRN
Start: 2022-02-02 — End: 2022-02-02
  Administered 2022-02-02: 40 mg via INTRAVENOUS

## 2022-02-02 MED ORDER — ENOXAPARIN SODIUM 80 MG/0.8ML IJ SOSY: 70.0000 mg | PREFILLED_SYRINGE | INTRAMUSCULAR | Status: DC

## 2022-02-02 MED ORDER — DEXAMETHASONE SODIUM PHOSPHATE 4 MG/ML IJ SOLN
INTRAMUSCULAR | Status: DC | PRN
  Administered 2022-02-02: 10 mg via INTRAVENOUS

## 2022-02-02 MED ORDER — PROPOFOL 10 MG/ML IV BOLUS
INTRAVENOUS | Status: DC | PRN
  Administered 2022-02-02: 200 mg via INTRAVENOUS

## 2022-02-02 MED ORDER — SUGAMMADEX SODIUM 200 MG/2ML IV SOLN
INTRAVENOUS | Status: DC | PRN
  Administered 2022-02-02 (×2): 200 mg via INTRAVENOUS

## 2022-02-02 MED ORDER — FENTANYL CITRATE (PF) 250 MCG/5ML IJ SOLN
INTRAMUSCULAR | Status: AC
  Filled 2022-02-02: qty 5

## 2022-02-02 MED ORDER — ROCURONIUM BROMIDE 10 MG/ML (PF) SYRINGE
PREFILLED_SYRINGE | INTRAVENOUS | Status: AC
  Filled 2022-02-02: qty 10

## 2022-02-02 MED ORDER — SODIUM CHLORIDE 0.9 % IR SOLN
Status: DC | PRN
  Administered 2022-02-02: 1000 mL

## 2022-02-02 MED ORDER — FENTANYL CITRATE (PF) 100 MCG/2ML IJ SOLN
INTRAMUSCULAR | Status: DC | PRN
  Administered 2022-02-02 (×2): 100 ug via INTRAVENOUS

## 2022-02-02 MED ORDER — HYDROCHLOROTHIAZIDE 25 MG PO TABS
25.0000 mg | ORAL_TABLET | Freq: Every day | ORAL | Status: DC
  Administered 2022-02-02 – 2022-02-05 (×4): 25 mg via ORAL
  Filled 2022-02-02 (×4): qty 1

## 2022-02-02 SURGICAL SUPPLY — 36 items
BAG COUNTER SPONGE SURGICOUNT (BAG) IMPLANT
BLADE SURG 15 STRL LF DISP TIS (BLADE) ×1 IMPLANT
BLADE SURG 15 STRL SS (BLADE) ×2
COVER SURGICAL LIGHT HANDLE (MISCELLANEOUS) ×2 IMPLANT
DRAPE LAPAROTOMY T 102X78X121 (DRAPES) ×2 IMPLANT
DRSG PAD ABDOMINAL 8X10 ST (GAUZE/BANDAGES/DRESSINGS) ×2 IMPLANT
ELECT REM PT RETURN 15FT ADLT (MISCELLANEOUS) ×2 IMPLANT
GAUZE 4X4 16PLY ~~LOC~~+RFID DBL (SPONGE) ×2 IMPLANT
GAUZE PAD ABD 8X10 STRL (GAUZE/BANDAGES/DRESSINGS) ×1 IMPLANT
GAUZE SPONGE 4X4 12PLY STRL (GAUZE/BANDAGES/DRESSINGS) ×2 IMPLANT
GLOVE ECLIPSE 8.0 STRL XLNG CF (GLOVE) ×2 IMPLANT
GLOVE INDICATOR 8.0 STRL GRN (GLOVE) ×2 IMPLANT
GOWN STRL REUS W/ TWL XL LVL3 (GOWN DISPOSABLE) ×3 IMPLANT
GOWN STRL REUS W/TWL XL LVL3 (GOWN DISPOSABLE) ×6
KIT BASIN OR (CUSTOM PROCEDURE TRAY) ×2 IMPLANT
KIT TURNOVER KIT A (KITS) IMPLANT
NEEDLE HYPO 22GX1.5 SAFETY (NEEDLE) ×3 IMPLANT
PACK BASIC VI WITH GOWN DISP (CUSTOM PROCEDURE TRAY) ×2 IMPLANT
PANTS MESH DISP LRG (UNDERPADS AND DIAPERS) ×1 IMPLANT
PANTS MESH DISPOSABLE L (UNDERPADS AND DIAPERS) ×1
PENCIL SMOKE EVACUATOR (MISCELLANEOUS) IMPLANT
SUCTION FRAZIER HANDLE 12FR (TUBING)
SUCTION TUBE FRAZIER 12FR DISP (TUBING) IMPLANT
SURGILUBE 2OZ TUBE FLIPTOP (MISCELLANEOUS) ×2 IMPLANT
SUT CHROMIC 2 0 SH (SUTURE) IMPLANT
SUT CHROMIC 3 0 SH 27 (SUTURE) IMPLANT
SUT VIC AB 2-0 UR6 27 (SUTURE) IMPLANT
SWAB COLLECTION DEVICE MRSA (MISCELLANEOUS) IMPLANT
SWAB CULTURE ESWAB REG 1ML (MISCELLANEOUS) IMPLANT
SYR 20ML LL LF (SYRINGE) ×2 IMPLANT
SYR 3ML LL SCALE MARK (SYRINGE) IMPLANT
SYR BULB IRRIG 60ML STRL (SYRINGE) IMPLANT
SYR CONTROL 10ML LL (SYRINGE) ×1 IMPLANT
TAPE CLOTH SURG 6X10 WHT LF (GAUZE/BANDAGES/DRESSINGS) ×1 IMPLANT
TOWEL OR 17X26 10 PK STRL BLUE (TOWEL DISPOSABLE) ×3 IMPLANT
TOWEL OR NON WOVEN STRL DISP B (DISPOSABLE) ×2 IMPLANT

## 2022-02-02 NOTE — Anesthesia Procedure Notes (Signed)
Procedure Name: Intubation ?Date/Time: 02/02/2022 6:27 PM ?Performed by: Gerald Leitz, CRNA ?Pre-anesthesia Checklist: Patient identified, Patient being monitored, Timeout performed, Emergency Drugs available and Suction available ?Patient Re-evaluated:Patient Re-evaluated prior to induction ?Oxygen Delivery Method: Circle system utilized ?Preoxygenation: Pre-oxygenation with 100% oxygen ?Induction Type: IV induction ?Ventilation: Mask ventilation without difficulty ?Laryngoscope Size: Mac and 3 ?Grade View: Grade I ?Tube type: Oral ?Tube size: 7.0 mm ?Number of attempts: 1 ?Airway Equipment and Method: Stylet ?Placement Confirmation: ETT inserted through vocal cords under direct vision, positive ETCO2 and breath sounds checked- equal and bilateral ?Secured at: 23 cm ?Tube secured with: Tape ?Dental Injury: Teeth and Oropharynx as per pre-operative assessment  ? ? ? ? ?

## 2022-02-02 NOTE — Progress Notes (Signed)
Pharmacy Antibiotic Note ? ?Yvette Perry is a 46 y.o. female admitted on 01/30/2022 with  cellulitis with abscess .  Pharmacy has been consulted for vanc dosing. Rocephin per Md ? ?Plan: ?Vanc 2g IV x 1 then '1250mg'$  IV q12 - goal AUC 400-550 ?Rocephin per Md ?Plan for I&D per surgery 4/22, follow up wound/abscess cultures ? ?Height: '5\' 6"'$  (167.6 cm) ?Weight: (!) 140.6 kg (309 lb 15.5 oz) ?IBW/kg (Calculated) : 59.3 ? ?Temp (24hrs), Avg:98.3 ?F (36.8 ?C), Min:97.4 ?F (36.3 ?C), Max:98.8 ?F (37.1 ?C) ? ?Recent Labs  ?Lab 01/30/22 ?1930 02/01/22 ?7001 02/02/22 ?0420  ?WBC 9.7 8.8 9.3  ?CREATININE 0.99 0.83 0.79  ?  ?Estimated Creatinine Clearance: 127.3 mL/min (by C-G formula based on SCr of 0.79 mg/dL).   ? ?No Known Allergies ? ? ? ?Thank you for allowing pharmacy to be a part of this patient?s care. ? ?Kara Mead ?02/02/2022 12:13 PM ? ?

## 2022-02-02 NOTE — Consult Note (Addendum)
? ? ? ?Yvette Perry ?[birth year]  ?[id number].   ? ?Requesting MD: Antonieta Pert, MD ?Chief Complaint/Reason for Consult: cellulitis with suspected abscess ? ?HPI:  ?This is a pleasant 46 year old female with a past medical history of diabetes, asthma, goiter, obesity, hypertension, uterine fibroids/abnormal periods, and migraines who presented to the emergency department with a chief complaint of right-sided chest wall pain.  States she first noticed this last Thursday and went to urgent care to be evaluated.  They recommended that she go to the emergency department.  She was seen in the emergency department 4/14 where they felt she had a skin and soft tissue infection and prescribed her oral Keflex.  Patient felt the Keflex may have helped a little bit with the amount of pressure over her lateral chest wall but ultimately the pain continued to worsen, she had fevers at home, along with night sweats so she returned to the emergency department for evaluation.  CT of the chest was performed which showed stranding within the soft tissues of the right lateral chest wall along with possible phlegmonous changes and no definite abscess.  Due to concern for underlying abscess General surgery has been asked to consult.  Patient denies tobacco use.  Reports occasional alcohol use.  Denies the use of blood thinning medications.  She is currently employed as a Armed forces operational officer and states she has the opportunity to work remotely.  States her sister is an Therapist, sports and would be able to help her with wound packing. ? ?ROS: ?Review of Systems  ?Constitutional:  Positive for chills and fever.  ?HENT: Negative.    ?Eyes: Negative.   ?Respiratory: Negative.    ?Cardiovascular: Negative.   ?Gastrointestinal: Negative.   ?Genitourinary: Negative.   ?Musculoskeletal: Negative.   ?Skin: Negative.   ?Neurological: Negative.   ?Endo/Heme/Allergies: Negative.   ?Psychiatric/Behavioral: Negative.    ? ?Family History  ?Problem Relation Age of Onset   ? Hypertension Father   ? Diabetes Father   ? Kidney failure Father   ? Hypertension Mother   ? ? ?Past Medical History:  ?Diagnosis Date  ? Abnormal Pap smear of cervix   ? LGSIL  ? Asthma   ? Diabetes mellitus ([place])   ? Goiter   ? Hypertension   ? Migraine   ? Pre-diabetes   ? Uterine fibroid   ? ? ?Past Surgical History:  ?Procedure Laterality Date  ? CESAREAN SECTION  06/2003  ? COLPOSCOPY  12/22/2008  ? normal  ? ENDOMETRIAL BIOPSY  06/30/2013  ? benign  ? TUBAL LIGATION  06/2003  ? ? ?Social History:  reports that she has never smoked. She has never used smokeless tobacco. She reports current alcohol use. She reports that she does not use drugs. ? ?Allergies: No Known Allergies ? ?Medications Prior to Admission  ?Medication Sig Dispense Refill  ? ascorbic acid (VITAMIN C) 500 MG tablet Take 500 mg by mouth 3 (three) times daily. With rose hips    ? B Complex-C-Biotin-D-Zinc-FA (VITAL-D RX PO) Take 1,000 mg by mouth daily.    ? butalbital-acetaminophen-caffeine (FIORICET WITH CODEINE) 50-325-40-30 MG capsule Take 1 capsule by mouth every 4 (four) hours as needed for headache.    ? cephALEXin (KEFLEX) 500 MG capsule Take 1 capsule (500 mg total) by mouth 4 (four) times daily. 28 capsule 0  ? cholecalciferol (VITAMIN D3) 25 MCG (1000 UT) tablet Take 1,000 Units by mouth daily.    ? diphenhydrAMINE (BENADRYL) 25 MG tablet Take by mouth.    ?  EPINEPHrine (EPIPEN 2-PAK) 0.3 mg/0.3 mL IJ SOAJ injection Inject 0.3 mLs (0.3 mg total) into the muscle once as needed (allergic reaction). (Patient taking differently: Inject 0.3 mg into the muscle once as needed for anaphylaxis (allergic reaction).) 2 Device 0  ? ferrous sulfate 325 (65 FE) MG tablet Take 325 mg by mouth 3 (three) times daily with meals.    ? fexofenadine (ALLEGRA) 180 MG tablet Take 180 mg by mouth daily.    ? fluticasone (FLONASE) 50 MCG/ACT nasal spray Place 1 spray into the nose daily.     ? fluticasone-salmeterol (ADVAIR) 500-50 MCG/ACT AEPB Inhale 1  puff into the lungs in the morning and at bedtime.    ? gabapentin (NEURONTIN) 300 MG capsule Take 300 mg by mouth as needed (moderate pain).    ? ibuprofen (ADVIL,MOTRIN) 800 MG tablet Take 1 tablet (800 mg total) by mouth every 8 (eight) hours as needed for cramping. 30 tablet 2  ? ipratropium (ATROVENT) 0.03 % nasal spray Place 2 sprays into both nostrils 3 (three) times daily.    ? Ipratropium-Albuterol (COMBIVENT RESPIMAT) 20-100 MCG/ACT AERS respimat Inhale 2 puffs into the lungs every 6 (six) hours as needed.    ? lisinopril-hydrochlorothiazide (PRINZIDE,ZESTORETIC) 20-12.5 MG per tablet Take 1 tablet by mouth daily.      ? megestrol (MEGACE) 40 MG tablet Take 40 mg by mouth. Takes if needed r/t cycle    ? metFORMIN (GLUCOPHAGE-XR) 500 MG 24 hr tablet Take 1,000 mg by mouth in the morning and at bedtime.    ? montelukast (SINGULAIR) 10 MG tablet Take 10 mg by mouth at bedtime.    ? ondansetron (ZOFRAN-ODT) 4 MG disintegrating tablet Take 1-2 tablets (4-8 mg total) by mouth every 8 (eight) hours as needed. 30 tablet 3  ? prochlorperazine (COMPAZINE) 10 MG tablet Take 10 mg by mouth every 6 (six) hours as needed for nausea or vomiting.    ? rizatriptan (MAXALT-MLT) 10 MG disintegrating tablet Take 1 tablet (10 mg total) by mouth as needed for migraine. May repeat in 2 hours if needed 9 tablet 11  ? albuterol (PROVENTIL HFA;VENTOLIN HFA) 108 (90 BASE) MCG/ACT inhaler Inhale 2 puffs into the lungs every 6 (six) hours as needed. For shortness of breath  (Patient not taking: Reported on 01/31/2022)    ? topiramate (TOPAMAX) 50 MG tablet Start with one pill at bedtime('50mg'$ ). In 2 weeks, if no side effects, increase to 2 pills ('100mg'$ ) at bedtime (Patient not taking: Reported on 01/31/2022) 60 tablet 6  ? ? ? ?Physical Exam: ?Blood pressure (!) 157/101, pulse 78, temperature (!) 97.4 ?F (36.3 ?C), temperature source Oral, resp. rate 18, height '5\' 6"'$  (1.676 m), weight (!) 140.6 kg, last menstrual period 01/25/2022, SpO2  100 %. ?General: Pleasant black female sitting up in bed, NAD ?HEENT: head -normocephalic, atraumatic; Eyes: PERRLA, no conjunctival injection ?Neck- Trachea is midline, visible goiter, no JVD appreciated.  ?CV- RRR  ?Pulm- breathing is non-labored ORA ?Right lateral chest will - there is a large ~ 7x4 cm carbuncle with surrounding induration and cellulitis. Tender to palpation. Draining some cloudy drainage.  ?Abd- soft, NT/ND ?GU- deferred  ?MSK- UE/LE symmetrical, no cyanosis, clubbing, or edema. ?Neuro- CN II-XII grossly in tact ?Psych- Alert and Oriented x3 with appropriate affect ?Skin: warm and dry, no rashes or lesions ? ? ?Results for orders placed or performed during the hospital encounter of 01/30/22 (from the past 48 hour(s))  ?Glucose, capillary     Status: Abnormal  ?  Collection Time: 01/31/22  5:18 PM  ?Result Value Ref Range  ? Glucose-Capillary 130 (H) 70 - 99 mg/dL  ?  Comment: Glucose reference range applies only to samples taken after fasting for at least 8 hours.  ?MRSA Next Gen by PCR, Nasal     Status: None  ? Collection Time: 01/31/22  5:50 PM  ? Specimen: Nasal Mucosa; Nasal Swab  ?Result Value Ref Range  ? MRSA by PCR Next Gen NOT DETECTED NOT DETECTED  ?  Comment: (NOTE) ?The GeneXpert MRSA Assay (FDA approved for NASAL specimens only), ?is one component of a comprehensive MRSA colonization surveillance ?program. It is not intended to diagnose MRSA infection nor to guide ?or monitor treatment for MRSA infections. ?Test performance is not FDA approved in patients less than 2 years ?old. ?Performed at [facility], [address] ?[place], [place] [address] ?  ?Glucose, capillary     Status: Abnormal  ? Collection Time: 01/31/22  9:48 PM  ?Result Value Ref Range  ? Glucose-Capillary 167 (H) 70 - 99 mg/dL  ?  Comment: Glucose reference range applies only to samples taken after fasting for at least 8 hours.  ?HIV Antibody (routine testing w rflx)     Status: None  ?  Collection Time: 02/01/22  4:27 AM  ?Result Value Ref Range  ? HIV Screen 4th Generation wRfx Non Reactive Non Reactive  ?  Comment: Performed at [facility], [place] [address], [place], [place]

## 2022-02-02 NOTE — Hospital Course (Addendum)
46 y.o. female with medical history significant for hypertension, anemia on iron supplement, diabetes on metformin, asthma, history of migraine, morbid obesity BMI 50 returned to the ED with worsening of pain and swelling on the right lateral chest wall, initially seen in the ED on 4/14-for a rash that started on her right lateral chest  and had spread and was painful  w/o known trauma ?ED Course: Hemodynamically stable afebrile.CT chest w/ contrast:' Stranding within the subcutaneous soft tissues of the right lateral lower chest and upper abdominal wall which could reflect hematoma or phlegmon/infection. No drainable abscess. No acute cardiopulmonary disease.  Placed on empiric antibiotic and admitted.  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  Wound culture came back with MRSA, antibiotic regiment appropriately, she has been discharged with regular dressing instruction, patient and her sister has been educated in regard.  She will follow-up with surgery as outpatient.   ?

## 2022-02-02 NOTE — Progress Notes (Signed)
?PROGRESS NOTE ?Yvette Perry  PXT:[id number] DOB: 05-24-1976 DOA: 01/30/2022 ?PCP: Tempie Hoist, MD  ? ?Brief Narrative/Hospital Course: ?46 y.o. female with medical history significant for hypertension, anemia on iron supplement, diabetes on metformin, asthma, history of migraine, morbid obesity BMI 50 returned to the ED with worsening of pain and swelling on the right lateral chest wall, initially seen in the ED on 4/14-for a rash that started on her right lateral chest  and had spread and was painful  w/o known trauma ?ED Course: Hemodynamically stable afebrile.CT chest w/ contrast:' Stranding within the subcutaneous soft tissues of the right lateral lower chest and upper abdominal wall which could reflect hematoma or phlegmon/infection. No drainable abscess. No acute cardiopulmonary disease.  Placed on empiric antibiotic and admitted.  ?  ?Subjective: ?Seen and examined this morning.  Having serosanguineous drainage from the right flank cellulitis area reports she feels somewhat better today. ?Overnight afebrile Labs stable ? ?Assessment and Plan: ?Principal Problem: ?  Cellulitis ?Active Problems: ?  Migraine ?  Diabetes mellitus ([place]) ?  Asthma ?  Hypertension ?  Obesity, Class III, BMI 40-49.9 (morbid obesity) ([place]) ?  Hypokalemia ?  Normocytic anemia ?  Cellulitis of chest wall ? ?Cellulitis right lateral chest wall, failed outpatient Keflex: CT chest with no drainable fluid collection, consistent with cellulitis.  Has ongoing induration now with some drainage, area looks more red, suspect abscess, consult surgery to see if it can be drained/culture.  Change antibiotics to vancomycin/ceftriaxone.  Pharmacy to dose.Cont pain control iv morphine, norco tylenol ?  ?Hx of Migraine: Continue home fiorecet with codeine ?  ?Diabetes mellitus not on long-term insulin: Borderline controlled A1c 7.4, will control at this time continue SSI and carb modified diet.  Holding metformin ?Recent Labs  ?Lab 02/01/22 ?0427  02/01/22 ?0729 02/01/22 ?1135 02/01/22 ?1704 02/01/22 ?2132 02/02/22 ?6270  ?GLUCAP  --  144* 125* 121* 134* 120*  ?HGBA1C 7.4*  --   --   --   --   --   ?   ?Asthma-not in exacerbation, continue her inhaler including-Dulera, as needed inhalers, Singulair ?  ?Hypertension: BP borderline controlled on home lisinopril HCTZ- increase dose. ?  ?Morbid obesity/Class III: Will benefit with weight loss PC follow-up and healthy lifestyle. ?  ?Hypokalemia: Resolved.   ?  ?Anemia hemoglobin at 10 g.  Likely chronic, with regular menstruation, she takes Megace as needed for heavy bleeding. Cont home iron supplement ?  ?DVT prophylaxis:  ?Code Status:   Code Status: Full Code ?Family Communication: plan of care discussed with patient at bedside. ?Patient status is: Inpatient level of care: Med-Surg  ?Remains inpatient because: Ongoing IV antibiotics ?Patient currently not stable ? ?Dispo: The patient is from: home ?           Anticipated disposition: home 1-2 days ? ?Mobility Assessment (last 72 hours)   ? ? Mobility Assessment   ? ? [place] Name 02/02/22 0930 02/01/22 1901 02/01/22 0849 01/31/22 1913 01/31/22 1700  ? Does patient have an order for bedrest or is patient medically unstable No - Continue assessment No - Continue assessment No - Continue assessment No - Continue assessment No - Continue assessment  ? What is the highest level of mobility based on the progressive mobility assessment? Level 6 (Walks independently in room and hall) - Balance while walking in room without assist - Complete Level 6 (Walks independently in room and hall) - Balance while walking in room without assist - Complete Level 6 (Walks  independently in room and hall) - Balance while walking in room without assist - Complete Level 6 (Walks independently in room and hall) - Balance while walking in room without assist - Complete Level 6 (Walks independently in room and hall) - Balance while walking in room without assist - Complete  ? ?  ?  ? ?  ?   ? ?Objective: ?Vitals last 24 hrs: ?Vitals:  ? 02/01/22 1320 02/01/22 2103 02/01/22 2137 02/02/22 0611  ?BP: (!) 181/95  (!) 160/96 (!) 157/101  ?Pulse: 92  80 78  ?Resp: '18  18 18  '$ ?Temp: 98.8 ?F (37.1 ?C)  98.6 ?F (37 ?C) (!) 97.4 ?F (36.3 ?C)  ?TempSrc: Oral  Oral Oral  ?SpO2: 99% 98% 93% 100%  ?Weight:      ?Height:      ? ?Weight change:  ? ?Physical Examination: ?General exam: AA OX3,older than stated age, weak appearing. ?HEENT:Oral mucosa moist, Ear/Nose WNL grossly, dentition normal. ?Respiratory system: bilaterally diminished,no use of accessory muscle ?Cardiovascular system: S1 & S2 +, No JVD,. ?Gastrointestinal system: Abdomen soft,NT,ND, BS+ ?Nervous System:Alert, awake, moving extremities and grossly nonfocal ?Extremities: edema neg,distal peripheral pulses palpable.  ?Skin: No rashes,no icterus. ?MSK: Normal muscle bulk,tone, power ?Right lateral chest wall with skin fold with area of induration and tenderness erythematous which is worse than before ? ?Medications reviewed: ?Scheduled Meds: ? enoxaparin (LOVENOX) injection  70 mg Subcutaneous Q24H  ? ferrous sulfate  325 mg Oral BID WC  ? fluticasone  1 spray Each Nare Daily  ? lisinopril  40 mg Oral Daily  ? And  ? hydrochlorothiazide  25 mg Oral Daily  ? insulin aspart  0-5 Units Subcutaneous QHS  ? insulin aspart  0-9 Units Subcutaneous TID WC  ? ipratropium  2 spray Each Nare TID  ? loratadine  10 mg Oral Daily  ? mometasone-formoterol  2 puff Inhalation BID  ? montelukast  10 mg Oral QHS  ? pantoprazole  40 mg Oral Daily  ? ?Continuous Infusions: ? ? ? ?  ?Diet Order   ? ?       ?  Diet Carb Modified Fluid consistency: Thin; Room service appropriate? Yes  Diet effective now       ?  ? ?  ?  ? ?  ?  ? ?  ?  ?  ? ? ?Intake/Output Summary (Last 24 hours) at 02/02/2022 1048 ?Last data filed at 02/02/2022 1000 ?Gross per 24 hour  ?Intake 1639.72 ml  ?Output 4200 ml  ?Net -2560.28 ml  ? ?Net IO Since Admission: -2,400.28 mL [02/02/22 1048]  ?Wt Readings  from Last 3 Encounters:  ?01/31/22 (!) 140.6 kg  ?01/26/22 (!) 140.6 kg  ?04/04/21 135.2 kg  ?  ? ?Unresulted Labs (From admission, onward)  ? ?  Start     Ordered  ? 02/07/22 0500  Creatinine, serum  (enoxaparin (LOVENOX)    CrCl >/= 30 ml/min)  Weekly,   R     ?Comments: while on enoxaparin therapy ?  ?Question:  Specimen collection method  Answer:  Lab=Lab collect  ? 01/31/22 1656  ? 02/02/22 5974  Basic metabolic panel  Daily,   R     ?Question:  Specimen collection method  Answer:  Lab=Lab collect  ? 02/01/22 1012  ? 02/02/22 0500  CBC  Daily,   R     ?Question:  Specimen collection method  Answer:  Lab=Lab collect  ? 02/01/22 1012  ? 02/01/22 0854  Aerobic  Culture w Gram Stain (superficial specimen)  Once,   R       ? 02/01/22 0853  ? ?  ?  ? ?  ?Data Reviewed: I have personally reviewed following labs and imaging studies ?CBC: ?Recent Labs  ?Lab 2022/02/11 ?1930 02/01/22 ?0539 02/02/22 ?0420  ?WBC 9.7 8.8 9.3  ?NEUTROABS 6.7  --   --   ?HGB 10.9* 10.1* 9.7*  ?HCT 35.5* 34.3* 32.7*  ?MCV 81.6 85.8 84.1  ?PLT 321 291 295  ? ?Basic Metabolic Panel: ?Recent Labs  ?Lab 02-11-2022 ?1930 02/01/22 ?7673 02/02/22 ?0420  ?NA 137 137 139  ?K 3.1* 3.8 3.9  ?CL 100 101 104  ?CO2 '27 31 27  '$ ?GLUCOSE 190* 177* 189*  ?BUN 22* 17 16  ?CREATININE 0.99 0.83 0.79  ?CALCIUM 8.7* 8.5* 8.5*  ? ?GFR: ?Estimated Creatinine Clearance: 127.3 mL/min (by C-G formula based on SCr of 0.79 mg/dL). ?Liver Function Tests: ?Recent Labs  ?Lab 02-11-22 ?1930  ?AST 12*  ?ALT 14  ?ALKPHOS 96  ?BILITOT 0.5  ?PROT 7.2  ?ALBUMIN 3.0*  ? ?No results for input(s): LIPASE, AMYLASE in the last 168 hours. ?No results for input(s): AMMONIA in the last 168 hours. ?Coagulation Profile: ?No results for input(s): INR, PROTIME in the last 168 hours. ?BNP (last 3 results) ?No results for input(s): PROBNP in the last 8760 hours. ?HbA1C: ?Recent Labs  ?  02/01/22 ?0427  ?HGBA1C 7.4*  ? ?CBG: ?Recent Labs  ?Lab 02/01/22 ?0729 02/01/22 ?1135 02/01/22 ?1704 02/01/22 ?2132  02/02/22 ?4193  ?GLUCAP 144* 125* 121* 134* 120*  ? ?Lipid Profile: ?No results for input(s): CHOL, HDL, LDLCALC, TRIG, CHOLHDL, LDLDIRECT in the last 72 hours. ?Thyroid Function Tests: ?No results for

## 2022-02-02 NOTE — Plan of Care (Signed)

## 2022-02-02 NOTE — Op Note (Signed)
02/02/2022 ? ?6:55 PM ? ?PATIENT:  Yvette Perry  46 y.o. female ? ?PRE-OPERATIVE DIAGNOSIS:  RIGHT CHEST WALL ABSCESS ? ?POST-OPERATIVE DIAGNOSIS:  RIGHT CHEST WALL ABSCESS ? ?PROCEDURE:  Procedure(s) with comments: ?INCISION AND DRAINAGE ABSCESS RIGHT CHEST WALL (Right)  ? ?SURGEON:  Surgeon(s) and Role: ?   Jovita Kussmaul, MD - Primary ? ?PHYSICIAN ASSISTANT:  ? ?ASSISTANTS: none  ? ?ANESTHESIA:   local and general ? ?EBL:  minimal  ? ?BLOOD ADMINISTERED:none ? ?DRAINS: none  ? ?LOCAL MEDICATIONS USED:  MARCAINE    ? ?SPECIMEN:  No Specimen ? ?DISPOSITION OF SPECIMEN:  N/A ? ?COUNTS:  YES ? ?TOURNIQUET:  * No tourniquets in log * ? ?DICTATION: .Dragon Dictation ? ?After informed consent was obtained the patient was brought to the operating room and placed in the supine position on the operating table.  After adequate induction of general anesthesia the patient was rolled slightly up on the right side with a beanbag and all pressure points were padded.  The right chest wall was then prepped with Betadine and draped in usual sterile manner.  An appropriate timeout was performed.  There was an obvious abscess cavity with fluctuance on the right chest wall.  The area overlying this was infiltrated with quarter percent Marcaine.  A transversely oriented incision was made through the middle of the area.  The subcutaneous tissue was then probed bluntly with a hemostat until access was gained to the abscess cavity.  Moderate amount of watery fluid was evacuated.  Cultures were obtained.  The abscess cavity was then probed with a finger and all loculations were broken up.  Once the entire abscess cavity was well drained then hemostasis was achieved using the Bovie electrocautery.  The abscess cavity was isolated to the subcutaneous tissue.  The wound was then packed with a Kerlix gauze moistened with saline.  Sterile dressings were then applied.  The patient tolerated the procedure well.  At the end of the case all  needle sponge and instrument counts were correct.  The patient was then awakened and taken to recovery in stable condition. ? ?PLAN OF CARE: Admit to inpatient  ? ?PATIENT DISPOSITION:  PACU - hemodynamically stable. ?  ?Delay start of Pharmacological VTE agent (>24hrs) due to surgical blood loss or risk of bleeding: no ? ?

## 2022-02-02 NOTE — Anesthesia Preprocedure Evaluation (Addendum)
Anesthesia Evaluation  ?Patient identified by MRN, date of birth, ID band ?Patient awake ? ? ? ?Reviewed: ?Allergy & Precautions, H&P , NPO status , Patient's Chart, lab work & pertinent test results ? ?History of Anesthesia Complications ?Negative for: history of anesthetic complications ? ?Airway ?Mallampati: I ? ?TM Distance: >3 FB ?Neck ROM: Full ? ? ? Dental ?no notable dental hx. ?(+) Teeth Intact, Dental Advisory Given ?  ?Pulmonary ?asthma ,  ?  ?Pulmonary exam normal ?breath sounds clear to auscultation ? ? ? ? ? ? Cardiovascular ?hypertension, Pt. on medications ? ?Rhythm:Regular Rate:Normal ? ? ?  ?Neuro/Psych ? Headaches, negative psych ROS  ? GI/Hepatic ?negative GI ROS, Neg liver ROS,   ?Endo/Other  ?diabetes, Type 2, Oral Hypoglycemic Agents ? Renal/GU ?negative Renal ROS  ?negative genitourinary ?  ?Musculoskeletal ?negative musculoskeletal ROS ?(+)  ? Abdominal ?  ?Peds ? Hematology ? ?(+) Blood dyscrasia, anemia ,   ?Anesthesia Other Findings ? ? Reproductive/Obstetrics ?negative OB ROS ? ?s/p tubal ligation ?Fibroids ? ? ?  ? ? ? ? ? ? ? ? ? ? ? ? ? ?  ?  ? ? ? ? ? ? ? ?Anesthesia Physical ?Anesthesia Plan ? ?ASA: 3 ? ?Anesthesia Plan: General  ? ?Post-op Pain Management: Tylenol PO (pre-op)* and Celebrex PO (pre-op)*  ? ?Induction: Intravenous ? ?PONV Risk Score and Plan: 3 and Treatment may vary due to age or medical condition, Ondansetron, Dexamethasone and Midazolam ? ?Airway Management Planned: Oral ETT ? ?Additional Equipment: None ? ?Intra-op Plan:  ? ?Post-operative Plan: Extubation in OR ? ?Informed Consent: I have reviewed the patients History and Physical, chart, labs and discussed the procedure including the risks, benefits and alternatives for the proposed anesthesia with the patient or authorized representative who has indicated his/her understanding and acceptance.  ? ? ? ?Dental advisory given and History available from chart only ? ?Plan Discussed  with: CRNA and Anesthesiologist ? ?Anesthesia Plan Comments:   ? ? ? ? ? ?Anesthesia Quick Evaluation ? ?

## 2022-02-02 NOTE — Transfer of Care (Signed)
Immediate Anesthesia Transfer of Care Note ? ?Patient: Yvette Perry ? ?Procedure(s) Performed: Procedure(s) with comments: ?INCISION AND DRAINAGE ABSCESS RIGHT CHEST WALL (Right) - LATERAL/PT ATE AT 0930 ? ?Patient Location: PACU ? ?Anesthesia Type:General ? ?Level of Consciousness: Alert, Awake, Oriented ? ?Airway & Oxygen Therapy: Patient Spontanous Breathing ? ?Post-op Assessment: Report given to RN ? ?Post vital signs: Reviewed and stable ? ?Last Vitals:  ?Vitals:  ? 02/02/22 0611 02/02/22 1323  ?BP: (!) 157/101 (!) 172/87  ?Pulse: 78 80  ?Resp: 18 15  ?Temp: (!) 36.3 ?C 36.7 ?C  ?SpO2: 100% 100%  ? ? ?Complications: No apparent anesthesia complications ? ?

## 2022-02-03 DIAGNOSIS — L02213 Cutaneous abscess of chest wall: Secondary | ICD-10-CM

## 2022-02-03 LAB — BASIC METABOLIC PANEL
Anion gap: 7 (ref 5–15)
BUN: 15 mg/dL (ref 6–20)
CO2: 28 mmol/L (ref 22–32)
Calcium: 8.9 mg/dL (ref 8.9–10.3)
Chloride: 100 mmol/L (ref 98–111)
Creatinine, Ser: 0.75 mg/dL (ref 0.44–1.00)
GFR, Estimated: >60 mL/min (ref >60)
Glucose, Bld: 252 mg/dL — ABNORMAL HIGH (ref 70–99)
Potassium: 4.8 mmol/L (ref 3.5–5.1)
Sodium: 135 mmol/L (ref 135–145)

## 2022-02-03 LAB — CBC
HCT: 35.4 % — ABNORMAL LOW (ref 36.0–46.0)
Hemoglobin: 10.8 g/dL — ABNORMAL LOW (ref 12.0–15.0)
MCH: 25.3 pg — ABNORMAL LOW (ref 26.0–34.0)
MCHC: 30.5 g/dL (ref 30.0–36.0)
MCV: 82.9 fL (ref 80.0–100.0)
Platelets: 371 10*3/uL (ref 150–400)
RBC: 4.27 MIL/uL (ref 3.87–5.11)
RDW: 16.5 % — ABNORMAL HIGH (ref 11.5–15.5)
WBC: 9.4 10*3/uL (ref 4.0–10.5)
nRBC: 0 % (ref 0.0–0.2)

## 2022-02-03 LAB — GLUCOSE, CAPILLARY
Glucose-Capillary: 130 mg/dL — ABNORMAL HIGH (ref 70–99)
Glucose-Capillary: 185 mg/dL — ABNORMAL HIGH (ref 70–99)
Glucose-Capillary: 160 mg/dL — ABNORMAL HIGH (ref 70–99)

## 2022-02-03 NOTE — Progress Notes (Signed)
Pt wristband would not scan for CBG this AM. Manual entry on meter #[id number]. Reading 177.  ?

## 2022-02-03 NOTE — Progress Notes (Signed)
?PROGRESS NOTE ?Yvette Perry  MGQ:[id number] DOB: 04-30-1976 DOA: 01/30/2022 ?PCP: Tempie Hoist, MD  ? ?Brief Narrative/Hospital Course: ?46 y.o. female with medical history significant for hypertension, anemia on iron supplement, diabetes on metformin, asthma, history of migraine, morbid obesity BMI 50 returned to the ED with worsening of pain and swelling on the right lateral chest wall, initially seen in the ED on 4/14-for a rash that started on her right lateral chest  and had spread and was painful  w/o known trauma ?ED Course: Hemodynamically stable afebrile.CT chest w/ contrast:' Stranding within the subcutaneous soft tissues of the right lateral lower chest and upper abdominal wall which could reflect hematoma or phlegmon/infection. No drainable abscess. No acute cardiopulmonary disease.  Placed on empiric antibiotic and admitted.  Placed on IV antibiotics that was changed to vancomycin and ceftriaxone, surgery consulted and underwent incision and drainage of the abscess in the right chest wall in OR 4/21  ?  ?Subjective: ?SEEN EXAMINED ?She reports she feels much improved after surgery.  Dressing in place. ?Overnight no fever, WBC remains stable. ? ?Assessment and Plan: ?Principal Problem: ?  Abscess of chest wall ?Active Problems: ?  Cellulitis ?  Migraine ?  Diabetes mellitus ([place]) ?  Asthma ?  Hypertension ?  Obesity, Class III, BMI 40-49.9 (morbid obesity) ([place]) ?  Hypokalemia ?  Normocytic anemia ? ?Cellulitis and abscess right lateral chest wall, failed outpatient Keflex: CT chest with no drainable fluid collection, consistent with cellulitis.  Despite on antibiotics still with persistent swelling induration -surgery consulted and underwent incision and drainage of the abscess in the right chest wall in OR 4/21, Gram stain from poor culture with few gram-positive cocci, follow-up culture continue on current vancomycin and ceftriaxone, pain control. ? ?Hx of Migraine: Continue home fiorecet with  codeine ?  ?Diabetes mellitus not on long-term insulin: Borderline controlled A1c 7.4, blood sugar fairly stable at this time continue SSI and carb modified diet.  Holding metformin ?Recent Labs  ?Lab 02/01/22 ?2671 02/01/22 ?0729 02/01/22 ?2132 02/02/22 ?2458 02/02/22 ?1138 02/02/22 ?1912 02/02/22 ?2143  ?GLUCAP  --    < > 134* 120* 130* 128* 156*  ?HGBA1C 7.4*  --   --   --   --   --   --   ? < > = values in this interval not displayed.  ?   ?Asthma-not in exacerbation, continue her inhaler including-Dulera, as needed inhalers, Singulair ?  ?Hypertension: BP now controlled after increasing home lisinopril HCTZ  ?  ?Morbid obesity/Class III: Will benefit with weight loss PC follow-up and healthy lifestyle. ?  ?Hypokalemia: Stable ?  ?Anemia hemoglobin at 10 g.  Overall stable likely chronic, with regular menstruation, she takes Megace as needed for heavy bleeding. Cont home iron supplement ?  ?DVT prophylaxis:  ?Code Status:   Code Status: Full Code ?Family Communication: plan of care discussed with patient at bedside. ?Patient status is: Inpatient level of care: Med-Surg  ?Remains inpatient because: Ongoing IV antibiotics ?Patient currently not stable ? ?Dispo: The patient is from: home ?           Anticipated disposition: home  2 days ? ?Mobility Assessment (last 72 hours)   ? ? Mobility Assessment   ? ? [place] Name 02/02/22 2010 02/02/22 0930 02/01/22 1901 02/01/22 0849 01/31/22 1913  ? Does patient have an order for bedrest or is patient medically unstable No - Continue assessment No - Continue assessment No - Continue assessment No - Continue assessment No -  Continue assessment  ? What is the highest level of mobility based on the progressive mobility assessment? Level 5 (Walks with assist in room/hall) - Balance while stepping forward/back and can walk in room with assist - Complete Level 6 (Walks independently in room and hall) - Balance while walking in room without assist - Complete Level 6 (Walks  independently in room and hall) - Balance while walking in room without assist - Complete Level 6 (Walks independently in room and hall) - Balance while walking in room without assist - Complete Level 6 (Walks independently in room and hall) - Balance while walking in room without assist - Complete  ? ? [place] Name 01/31/22 1700  ?  ?  ?  ?  ? Does patient have an order for bedrest or is patient medically unstable No - Continue assessment      ? What is the highest level of mobility based on the progressive mobility assessment? Level 6 (Walks independently in room and hall) - Balance while walking in room without assist - Complete      ? ?  ?  ? ?  ?  ? ?Objective: ?Vitals last 24 hrs: ?Vitals:  ? 02/02/22 2137 02/02/22 2301 02/03/22 0010 02/03/22 0400  ?BP: (!) 154/96 (!) 152/96 (!) 158/103 (!) 138/94  ?Pulse: 71 70 73 81  ?Resp: '20 16 16 20  '$ ?Temp: 97.9 ?F (36.6 ?C) 98 ?F (36.7 ?C) 98.2 ?F (36.8 ?C) 97.9 ?F (36.6 ?C)  ?TempSrc: Oral Oral Oral Oral  ?SpO2: 99% 98% 95% 96%  ?Weight:      ?Height:      ? ?Weight change:  ? ?Physical Examination: ?General exam: AA,older than stated age, weak appearing. ?HEENT:Oral mucosa moist, Ear/Nose WNL grossly, dentition normal. ?Respiratory system: bilaterally diminished,no use of accessory muscle ?Cardiovascular system: S1 & S2 +, No JVD,. ?Gastrointestinal system: Abdomen soft,NT,ND, BS+ ?Nervous System:Alert, awake, moving extremities and grossly nonfocal ?Extremities: edema neg,distal peripheral pulses palpable.  ?Skin: No rashes,no icterus. ?MSK: Normal muscle bulk,tone, power ?Right chest wall dressing changed by surgery this morning so did not remove. ? ?Medications reviewed: ?Scheduled Meds: ? ferrous sulfate  325 mg Oral BID WC  ? fluticasone  1 spray Each Nare Daily  ? lisinopril  40 mg Oral Daily  ? And  ? hydrochlorothiazide  25 mg Oral Daily  ? insulin aspart  0-5 Units Subcutaneous QHS  ? insulin aspart  0-9 Units Subcutaneous TID WC  ? ipratropium  2 spray Each Nare  TID  ? loratadine  10 mg Oral Daily  ? mometasone-formoterol  2 puff Inhalation BID  ? montelukast  10 mg Oral QHS  ? pantoprazole  40 mg Oral Daily  ? ?Continuous Infusions: ? cefTRIAXone (ROCEPHIN)  IV 2 g (02/02/22 1236)  ? vancomycin 10 mL/hr at 02/03/22 0405  ? ? ? ?  ?Diet Order   ? ?       ?  Diet regular Room service appropriate? Yes; Fluid consistency: Thin  Diet effective now       ?  ? ?  ?  ? ?  ?  ? ?  ?  ?  ? ? ?Intake/Output Summary (Last 24 hours) at 02/03/2022 0818 ?Last data filed at 02/03/2022 0405 ?Gross per 24 hour  ?Intake 1414.29 ml  ?Output 3550 ml  ?Net -2135.71 ml  ? ?Net IO Since Admission: -4,205.99 mL [02/03/22 0818]  ?Wt Readings from Last 3 Encounters:  ?01/31/22 (!) 140.6 kg  ?01/26/22 (!) 140.6 kg  ?  04/04/21 135.2 kg  ?  ? ?Unresulted Labs (From admission, onward)  ? ?  Start     Ordered  ? 02/02/22 7893  Basic metabolic panel  Daily,   R     ?Question:  Specimen collection method  Answer:  Lab=Lab collect  ? 02/01/22 1012  ? 02/02/22 0500  CBC  Daily,   R     ?Question:  Specimen collection method  Answer:  Lab=Lab collect  ? 02/01/22 1012  ? 02/01/22 0854  Aerobic Culture w Gram Stain (superficial specimen)  Once,   R       ? 02/01/22 0853  ? ?  ?  ? ?  ?Data Reviewed: I have personally reviewed following labs and imaging studies ?CBC: ?Recent Labs  ?Lab 03-Feb-2022 ?1930 02/01/22 ?8101 02/02/22 ?7510 02/03/22 ?0410  ?WBC 9.7 8.8 9.3 9.4  ?NEUTROABS 6.7  --   --   --   ?HGB 10.9* 10.1* 9.7* 10.8*  ?HCT 35.5* 34.3* 32.7* 35.4*  ?MCV 81.6 85.8 84.1 82.9  ?PLT 321 291 295 371  ? ?Basic Metabolic Panel: ?Recent Labs  ?Lab 02/03/2022 ?1930 02/01/22 ?2585 02/02/22 ?2778 02/03/22 ?0410  ?NA 137 137 139 135  ?K 3.1* 3.8 3.9 4.8  ?CL 100 101 104 100  ?CO2 '27 31 27 28  '$ ?GLUCOSE 190* 177* 189* 252*  ?BUN 22* '17 16 15  '$ ?CREATININE 0.99 0.83 0.79 0.75  ?CALCIUM 8.7* 8.5* 8.5* 8.9  ? ?GFR: ?Estimated Creatinine Clearance: 127.3 mL/min (by C-G formula based on SCr of 0.75 mg/dL). ?Liver Function  Tests: ?Recent Labs  ?Lab 02/03/22 ?1930  ?AST 12*  ?ALT 14  ?ALKPHOS 96  ?BILITOT 0.5  ?PROT 7.2  ?ALBUMIN 3.0*  ? ?No results for input(s): LIPASE, AMYLASE in the last 168 hours. ?No results for input(s): AMMONIA in th

## 2022-02-03 NOTE — Progress Notes (Signed)
1 Day Post-Op  ? ?Subjective/Chief Complaint: ?Complains only of some soreness ? ? ?Objective: ?Vital signs in last 24 hours: ?Temp:  [97.9 ?F (36.6 ?C)-98.2 ?F (36.8 ?C)] 97.9 ?F (36.6 ?C) (04/22 0400) ?Pulse Rate:  [70-102] 81 (04/22 0400) ?Resp:  [11-33] 20 (04/22 0400) ?BP: (138-172)/(84-106) 138/94 (04/22 0400) ?SpO2:  [95 %-100 %] 96 % (04/22 0400) ?Last BM Date : 01/29/22 ? ?Intake/Output from previous day: ?04/21 0701 - 04/22 0700 ?In: 1414.3 [P.O.:120; I.V.:878; IV Piggyback:416.3] ?Out: 3550 [[id number]] ?Intake/Output this shift: ?No intake/output data recorded. ? ?General appearance: alert and cooperative ?Resp: clear to auscultation bilaterally ?Chest wall: dressing clean ?Cardio: regular rate and rhythm ?GI: soft, non-tender; bowel sounds normal; no masses,  no organomegaly ? ?Lab Results:  ?Recent Labs  ?  02/02/22 ?0420 02/03/22 ?0410  ?WBC 9.3 9.4  ?HGB 9.7* 10.8*  ?HCT 32.7* 35.4*  ?PLT 295 371  ? ?BMET ?Recent Labs  ?  02/02/22 ?0420 02/03/22 ?0410  ?NA 139 135  ?K 3.9 4.8  ?CL 104 100  ?CO2 27 28  ?GLUCOSE 189* 252*  ?BUN 16 15  ?CREATININE 0.79 0.75  ?CALCIUM 8.5* 8.9  ? ?PT/INR ?No results for input(s): LABPROT, INR in the last 72 hours. ?ABG ?No results for input(s): PHART, HCO3 in the last 72 hours. ? ?Invalid input(s): PCO2, PO2 ? ?Studies/Results: ?No results found. ? ?Anti-infectives: ?Anti-infectives (From admission, onward)  ? ? Start     Dose/Rate Route Frequency Ordered Stop  ? 02/03/22 0200  vancomycin (VANCOREADY) IVPB 1250 mg/250 mL       ? 1,250 mg ?166.7 mL/hr over 90 Minutes Intravenous Every 12 hours 02/02/22 1215    ? 02/02/22 1315  vancomycin (VANCOREADY) IVPB 2000 mg/400 mL       ? 2,000 mg ?200 mL/hr over 120 Minutes Intravenous  Once 02/02/22 1215 02/02/22 1533  ? 02/02/22 1215  cefTRIAXone (ROCEPHIN) 2 g in sodium chloride 0.9 % 100 mL IVPB       ? 2 g ?200 mL/hr over 30 Minutes Intravenous Daily 02/02/22 1149    ? 02/01/22 1000  doxycycline (VIBRAMYCIN) 100 mg in sodium  chloride 0.9 % 250 mL IVPB  Status:  Discontinued       ? 100 mg ?125 mL/hr over 120 Minutes Intravenous Every 12 hours 02/01/22 0854 02/02/22 1014  ? 01/31/22 2200  ceFAZolin (ANCEF) IVPB 2g/100 mL premix  Status:  Discontinued       ? 2 g ?200 mL/hr over 30 Minutes Intravenous Every 8 hours 01/31/22 1656 02/02/22 1014  ? 01/30/22 2230  cefTRIAXone (ROCEPHIN) 2 g in sodium chloride 0.9 % 100 mL IVPB       ? 2 g ?200 mL/hr over 30 Minutes Intravenous  Once 01/30/22 2221 01/30/22 2350  ? ?  ? ? ?Assessment/Plan: ?s/p Procedure(s) with comments: ?INCISION AND DRAINAGE ABSCESS RIGHT CHEST WALL (Right) - LATERAL/PT ATE AT 0930 ?Advance diet ?Start dressing changes today ?Continue IV abx until wbc normal and cellulitis resolves ? LOS: 2 days  ? ? ?Autumn Messing III ?02/03/2022 ? ?

## 2022-02-03 NOTE — Plan of Care (Signed)

## 2022-02-04 ENCOUNTER — Encounter (HOSPITAL_COMMUNITY): Payer: Self-pay | Admitting: General Surgery

## 2022-02-04 DIAGNOSIS — L02213 Cutaneous abscess of chest wall: Secondary | ICD-10-CM | POA: Diagnosis not present

## 2022-02-04 LAB — GLUCOSE, CAPILLARY
Glucose-Capillary: 141 mg/dL — ABNORMAL HIGH (ref 70–99)
Glucose-Capillary: 116 mg/dL — ABNORMAL HIGH (ref 70–99)
Glucose-Capillary: 112 mg/dL — ABNORMAL HIGH (ref 70–99)
Glucose-Capillary: 152 mg/dL — ABNORMAL HIGH (ref 70–99)

## 2022-02-04 LAB — BASIC METABOLIC PANEL
Anion gap: 8 (ref 5–15)
BUN: 28 mg/dL — ABNORMAL HIGH (ref 6–20)
CO2: 27 mmol/L (ref 22–32)
Calcium: 8.7 mg/dL — ABNORMAL LOW (ref 8.9–10.3)
Chloride: 102 mmol/L (ref 98–111)
Creatinine, Ser: 0.88 mg/dL (ref 0.44–1.00)
GFR, Estimated: >60 mL/min (ref >60)
Glucose, Bld: 174 mg/dL — ABNORMAL HIGH (ref 70–99)
Potassium: 3.9 mmol/L (ref 3.5–5.1)
Sodium: 137 mmol/L (ref 135–145)

## 2022-02-04 LAB — CBC
HCT: 34.8 % — ABNORMAL LOW (ref 36.0–46.0)
Hemoglobin: 10.2 g/dL — ABNORMAL LOW (ref 12.0–15.0)
MCH: 24.7 pg — ABNORMAL LOW (ref 26.0–34.0)
MCHC: 29.3 g/dL — ABNORMAL LOW (ref 30.0–36.0)
MCV: 84.3 fL (ref 80.0–100.0)
Platelets: 338 10*3/uL (ref 150–400)
RBC: 4.13 MIL/uL (ref 3.87–5.11)
RDW: 17.1 % — ABNORMAL HIGH (ref 11.5–15.5)
WBC: 10.1 10*3/uL (ref 4.0–10.5)
nRBC: 0 % (ref 0.0–0.2)

## 2022-02-04 MED ORDER — ENOXAPARIN SODIUM 80 MG/0.8ML IJ SOSY
70.0000 mg | PREFILLED_SYRINGE | INTRAMUSCULAR | Status: DC
  Administered 2022-02-04: 70 mg via SUBCUTANEOUS
  Filled 2022-02-04: qty 0.8

## 2022-02-04 MED ORDER — AMOXICILLIN-POT CLAVULANATE 875-125 MG PO TABS: 1.0000 | ORAL_TABLET | Freq: Two times a day (BID) | ORAL | 1 refills | Status: DC

## 2022-02-04 MED ORDER — ENOXAPARIN SODIUM 40 MG/0.4ML IJ SOSY
40.0000 mg | PREFILLED_SYRINGE | INTRAMUSCULAR | Status: DC
Start: 2022-02-04 — End: 2022-02-04

## 2022-02-04 NOTE — Progress Notes (Signed)
? ?Yvette Perry ?[id number] ?Jun 10, 1976 ? ?CARE TEAM: ? ?PCP: Tempie Hoist, MD ? ?Outpatient Care Team: Patient Care Team: ?Tempie Hoist, MD as PCP - General (Internal Medicine) ? ?Inpatient Treatment Team: Treatment Team: Attending Provider: Antonieta Pert, MD; Rounding Team: Fatima Blank, MD; Technician: Jeralyn Ruths, NT; Social Worker: Evans Lance; Pharmacist: Adrian Saran, [facility]; Consulting Physician: Nolon Nations, MD; Registered Nurse: Monico Hoar, RN; Registered Nurse: Orbie Pyo, RN; Utilization Review: Alease Medina, RN; Registered Nurse: Dorinda Hill, RN ? ? ?Problem List:  ? ?Principal Problem: ?  Abscess of chest wall ?Active Problems: ?  Cellulitis ?  Migraine ?  Diabetes mellitus ([place]) ?  Asthma ?  Hypertension ?  Obesity, Class III, BMI 40-49.9 (morbid obesity) ([place]) ?  Hypokalemia ?  Normocytic anemia ? ? ?2 Days Post-Op  02/02/2022 ? ?POST-OPERATIVE DIAGNOSIS:  RIGHT CHEST WALL ABSCESS ?  ?PROCEDURE:  INCISION AND DRAINAGE ABSCESS RIGHT CHEST WALL  ?  ?SURGEON:  Jovita Kussmaul, MD  ?  ? ? ? ?Assessment ? ?Right chest wall abscess improved after incision and drainage. ? ?(Hospital Stay = 3 days) ? ?Plan: ? ?-Base of wound looks clean.  No need for further aggressive debridement. ?-Packing twice daily with dressing care.  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  They feel comfortable doing that if properly trained.  Discussed with surgery nurse, Helene Kelp.  Start dressing care now. ?-Continue IV antibiotics.  Can transition to oral antibiotics at discharge.  I wrote for Augmentin x5 more days since she worsened on Keflex.  No evidence of MRSA ?-VTE prophylaxis- SCDs, etc ?-mobilize as tolerated to help recovery ? ?Disposition:  ?Disposition:  ?The patient is from: Home ? ?Anticipate discharge to:  Home ? ?Anticipated Date of Discharge is:  April 23,2023 ?  ? ?Barriers to discharge:  Pending Clinical improvement (more likely than not) ? ?Patient currently is Stable  for discharge from the hospital from a surgery standpoint. ? ? ? ? ? ?I reviewed nursing notes, hospitalist notes, last 24 h vitals and pain scores, last 48 h intake and output, last 24 h labs and trends, and last 24 h imaging results. I have reviewed this patient's available data, including medical history, events of note, test results, etc as part of my evaluation.  A significant portion of that time was spent in counseling.  Care during the described time interval was provided by me. ? ?This care required moderate level of medical decision making.  02/04/2022 ? ? ? ?Subjective: ?(Chief complaint) ? ?Patient feeling much better overall. ? ?Pain less. ? ?No dressing change done yet. ? ?Objective: ? ?Vital signs: ? ?Vitals:  ? 02/03/22 1812 02/03/22 2013 02/03/22 2114 02/04/22 0601  ?BP: (!) 153/91  122/71 135/87  ?Pulse: 86  83 74  ?Resp: '18  16 16  '$ ?Temp: 97.7 ?F (36.5 ?C)  98.4 ?F (36.9 ?C) 98 ?F (36.7 ?C)  ?TempSrc: Oral  Oral Oral  ?SpO2: 98% 98% 98% 97%  ?Weight:      ?Height:      ? ? ?Last BM Date : 01/29/22 ? ?Intake/Output  ? ?Yesterday: ? 04/22 0701 - 04/23 0700 ?In: 3800.3 [P.O.:3160; IV Piggyback:640.3] ?Out: 0  ?This shift: ? No intake/output data recorded. ? ?Bowel function: ? Flatus: YES ? BM:  No ? Drain: (No drain) ? ? ?Physical Exam: ? ?General: Pt awake/alert in no acute distress ?Eyes: PERRL, normal EOM.  Sclera clear.  No icterus ?  Neuro: CN II-XII intact w/o focal sensory/motor deficits. ?Lymph: No head/neck/groin lymphadenopathy ?Psych:  No delerium/psychosis/paranoia.  Oriented x 4 ?HENT: Normocephalic, Mucus membranes moist.  No thrush ?Neck: Supple, No tracheal deviation.  No obvious thyromegaly ? ?Chest: Right posterior lateral chest wall dressing soaked with old blood.  I removed.  8 x 4 x 5 cm wound noted clean.  No granulation.  No more undermining or purulence.    Good respiratory excursion.  No audible wheezing ? ?CV:  Pulses intact.  Regular rhythm.  No major extremity edema ?MS:  Normal AROM mjr joints.  No obvious deformity ?Abdomen: Soft.  Nondistended.  Mildly tender at incisions only.  No evidence of peritonitis.  No incarcerated hernias. ?Ext:   No deformity.  No mjr edema.  No cyanosis ?Skin: No petechiae / purpurea.  No major sores.  Warm and dry ? ? ? ?Results:  ? ?Cultures: ?Recent Results (from the past 720 hour(s))  ?MRSA Next Gen by PCR, Nasal     Status: None  ? Collection Time: 01/31/22  5:50 PM  ? Specimen: Nasal Mucosa; Nasal Swab  ?Result Value Ref Range Status  ? MRSA by PCR Next Gen NOT DETECTED NOT DETECTED Final  ?  Comment: (NOTE) ?The GeneXpert MRSA Assay (FDA approved for NASAL specimens only), ?is one component of a comprehensive MRSA colonization surveillance ?program. It is not intended to diagnose MRSA infection nor to guide ?or monitor treatment for MRSA infections. ?Test performance is not FDA approved in patients less than 2 years ?old. ?Performed at [facility], [place] [address] ?[place], [place] [address] ?  ?Aerobic/Anaerobic Culture w Gram Stain (surgical/deep wound)     Status: None (Preliminary result)  ? Collection Time: 02/02/22  6:49 PM  ? Specimen: Chest; Abscess  ?Result Value Ref Range Status  ? Specimen Description   Final  ?  ABSCESS ?Performed at [facility], [place] [address] [place], [place] [address] ?  ? Special Requests   Final  ?  NONE CHEST ?Performed at [facility], [place] [address], [place], [place] [address] ?  ? Gram Stain   Final  ?  NO SQUAMOUS EPITHELIAL CELLS SEEN ?MODERATE WBC SEEN ?FEW GRAM POSITIVE COCCI ?  ? Culture   Final  ?  TOO YOUNG TO READ ?Performed at [facility], [place] [address], [place], [place] [address] ?  ? Report Status PENDING  Incomplete  ? ? ?Labs: ?Results for orders placed or performed during the hospital encounter of 01/30/22 (from the past 48 hour(s))  ?Glucose, capillary     Status: Abnormal  ? Collection Time: 02/02/22 11:38 AM  ?Result Value  Ref Range  ? Glucose-Capillary 130 (H) 70 - 99 mg/dL  ?  Comment: Glucose reference range applies only to samples taken after fasting for at least 8 hours.  ?Aerobic/Anaerobic Culture w Gram Stain (surgical/deep wound)     Status: None (Preliminary result)  ? Collection Time: 02/02/22  6:49 PM  ? Specimen: Chest; Abscess  ?Result Value Ref Range  ? Specimen Description    ?  ABSCESS ?Performed at [facility], [place] [address], [place], [place] [address] ?  ? Special Requests    ?  NONE CHEST ?Performed at [facility], [place] [address], [place],  [address] ?  ? Gram Stain    ?  NO SQUAMOUS EPITHELIAL CELLS SEEN ?MODERATE WBC SEEN ?FEW GRAM POSITIVE COCCI ?  ? Culture    ?  TOO YOUNG TO READ ?Performed at [facility], [place] [address], [place], [place] [address] ?  ? Report Status PENDING   ?Glucose, capillary     Status: Abnormal  ? Collection Time: 02/02/22  7:12 PM  ?Result Value Ref Range  ? Glucose-Capillary 128 (H) 70 - 99 mg/dL  ?  Comment: Glucose reference range applies only to samples taken after fasting for at least 8 hours.  ? Comment 1 Notify RN   ?Glucose, capillary     Status: Abnormal  ? Collection Time: 02/02/22  9:43 PM  ?Result Value Ref Range  ? Glucose-Capillary 156 (H) 70 - 99 mg/dL  ?  Comment: Glucose reference range applies only to samples taken after fasting for at least 8 hours.  ? Comment 1 Notify RN   ? Comment 2 Document in Chart   ?Basic metabolic panel     Status: Abnormal  ? Collection Time: 02/03/22  4:10 AM  ?Result Value Ref Range  ? Sodium 135 135 - 145 mmol/L  ? Potassium 4.8 3.5 - 5.1 mmol/L  ?  Comment: DELTA CHECK NOTED  ? Chloride 100 98 - 111 mmol/L  ? CO2 28 22 - 32 mmol/L  ? Glucose, Bld 252 (H) 70 - 99 mg/dL  ?  Comment: Glucose reference range applies only to samples taken after fasting for at least 8 hours.  ? BUN 15 6 - 20 mg/dL  ? Creatinine, Ser 0.75 0.44 - 1.00 mg/dL  ? Calcium 8.9 8.9 - 10.3 mg/dL  ? GFR, Estimated >60  >60 mL/min  ?  Comment: (NOTE) ?Calculated using the CKD-EPI Creatinine Equation (2021) ?  ? Anion gap 7 5 - 15  ?  Comment: Performed at [facility], [place] [address] [place], [place]

## 2022-02-04 NOTE — Discharge Instructions (Signed)

## 2022-02-04 NOTE — Discharge Summary (Signed)
Physician Discharge Summary  ?Yvette Perry PJK:[id number] DOB: 1976-09-22 DOA: 01/30/2022 ? ?PCP: Tempie Hoist, MD ? ?Admit date: 01/30/2022 ?Discharge date: 02/05/2022 ?Recommendations for Outpatient Follow-up:  ?Follow up with PCP and gen surgery in 1 weeks-call for appointment ?Please obtain BMP/CBC in one week ? ?Discharge Dispo: home ?Discharge Condition: Stable ?Code Status:   Code Status: Full Code ?Diet recommendation:  ?Diet Order   ? ?       ?  Diet - low sodium heart healthy       ?  ?  Diet Carb Modified Fluid consistency: Thin; Room service appropriate? Yes  Diet effective now       ?  ? ?  ?  ? ?  ?  ? ?Brief/Interim Summary: ?46 y.o. female with medical history significant for hypertension, anemia on iron supplement, diabetes on metformin, asthma, history of migraine, morbid obesity BMI 50 returned to the ED with worsening of pain and swelling on the right lateral chest wall, initially seen in the ED on 4/14-for a rash that started on her right lateral chest  and had spread and was painful  w/o known trauma ?ED Course: Hemodynamically stable afebrile.CT chest w/ contrast:' Stranding within the subcutaneous soft tissues of the right lateral lower chest and upper abdominal wall which could reflect hematoma or phlegmon/infection. No drainable abscess. No acute cardiopulmonary disease.  Placed on empiric antibiotic and admitted.  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  Wound culture came back with MRSA, antibiotic regiment appropriately, she has been discharged with regular dressing instruction, patient and her sister has been educated in regard.  She  will follow-up with surgery as outpatient.    ? ?Discharge Diagnoses:  ?Principal Problem: ?  Abscess of chest wall ?Active Problems: ?  Cellulitis ?  Migraine ?  Diabetes mellitus ([place]) ?  Asthma ?  Hypertension ?  Obesity, Class III, BMI 40-49.9 (morbid obesity) ([place]) ?  Hypokalemia ?  Normocytic anemia ? ?Cellulitis and abscess right lateral chest wall, failed outpatient Keflex: CT chest with no drainable fluid collection-consistent with cellulitis in ED. Admitted on iv antibiotics but had persistent swelling induration -s/p  I&D of the abscess in OR 4/21, Gram stain [facility] - culture w/ staphylococcus.  skin MRSA PCR was negative.  Man needing Norco Motrin and IV morphine for pain control. Managed with iv vancomycin and ceftriaxone.surgery nurse to teach dressing care to patient and sister today.  Culture came back with MRSA, antibiotic changed appropriately to doxycycline and being discharged with wound care instruction and medication surgical follow-up with outpatient  ?  ?Hx of Migraine: Continue home fiorecet with codeine ?  ?Diabetes mellitus not on long-term insulin: Borderline controlled A1c 7.4, blood sugar fairly stable, on SSI.  Holding metformin for now.  ?   ?Asthma-not in exacerbation, continue her inhaler including-Dulera, as needed inhalers, Singulair ?  ?Hypertension: Was borderline controlled,so doubled her home HCTZ lisinopril and BP stable now ?  ?Morbid obesity/Class III: Will benefit with weight loss PC follow-up and healthy lifestyle. ?  ?Hypokalemia: improved. ?  ?Anemia hemoglobin at 10 g.  Overall stable likely chronic, with regular menstruation, she takes  Megace as needed for heavy bleeding. Cont home iron supplement ? ?Consults: ?CCS ?Subjective: ?Alert oriented resting comfortably.  Pleasant. ? ?Discharge Exam: ?Vitals:  ? 02/04/22 2133 02/05/22 9563  ?BP: 139/79 129/69  ?Pulse: 77 (!) 59  ?Resp: 16 16  ?Temp: 98 ?F (36.7 ?C) 97.8 ?F (36.6 ?C)  ?SpO2: 98% 97%  ? ?General: Pt is alert,  awake, not in acute distress ?Cardiovascular: RRR, S1/S2 +, no rubs, no gallops ?Respiratory: CTA bilaterally, no wheezing, no rhonchi ?Abdominal: Soft, NT, ND, bowel sounds + ?Extremities: no edema, no cyanosis ? ?Discharge Instructions ? ?Discharge Instructions   ? ? Diet - low sodium heart healthy   Complete by: As directed ?  ? Discharge instructions   Complete by: As directed ?  ? Continue dressing changes as instructed by surgery team. ?Please call call MD or return to ER for similar or worsening recurring problem that brought you to hospital or if any fever,nausea/vomiting,abdominal pain, uncontrolled pain, chest pain,  shortness of breath or any other alarming symptoms. ? ?Please follow-up your doctor as instructed in a week time and call the office for appointment. ? ?Please avoid alcohol, smoking, or any other illicit substance and maintain healthy habits including taking your regular medications as prescribed. ? ?You were cared for by a hospitalist during your hospital stay. If you have any questions about your discharge medications or the care you received while you were in the hospital after you are discharged, you can call the unit and ask to speak with the hospitalist on call if the hospitalist that took care of you is not available. ? ?Once you are discharged, your primary care physician will handle any further medical issues. Please note that NO REFILLS for any discharge medications will be authorized once you are discharged, as it is imperative that you return to your primary care physician (or establish a relationship with a primary care physician if you do not have one) for your aftercare needs so that they can reassess your need for medications and monitor your lab values  ? Discharge wound care:   Complete by: As directed ?  ? Continue dressing changes with packing twice a day as instructed  ? Increase activity slowly   Complete by: As directed ?  ? ?  ? ?Allergies as of 02/05/2022   ?No Known  Allergies ?  ? ?  ?Medication List  ?  ? ?STOP taking these medications   ? ?cephALEXin 500 MG capsule ?Commonly known as: KEFLEX ?  ? ?  ? ?TAKE these medications   ? ?albuterol 108 (90 Base) MCG/ACT inhaler ?Commonly known as: VENTOLIN HFA ?Inhale 2 puffs into the lungs every 6 (six) hours as needed. For shortness of breath ?  ?ascorbic acid 500 MG tablet ?Commonly known as: VITAMIN C ?Take 500 mg by mouth 3 (three) times daily. With rose hips ?  ?butalbital-apap-caffeine-codeine 50-325-40-30 MG capsule ?Commonly known as: FIORICET WITH CODEINE ?Take 1 capsule by mouth every 4 (four) hours as needed for headache. ?  ?cholecalciferol 25 MCG (1000 UNIT) tablet ?Commonly known as: VITAMIN D3 ?Take 1,000 Units by mouth daily. ?  ?Combivent Respimat 20-100 MCG/ACT Aers respimat ?Generic drug: Ipratropium-Albuterol ?Inhale 2 puffs into the lungs every 6 (six) hours as needed. ?  ?diphenhydrAMINE 25 MG tablet ?Commonly known as: BENADRYL ?Take by mouth. ?  ?doxycycline 50 MG capsule ?Commonly known as: VIBRAMYCIN ?Take 1 capsule (50 mg total) by mouth 2 (two) times daily for 7 days. ?  ?EPINEPHrine  0.3 mg/0.3 mL Soaj injection ?Commonly known as: EpiPen 2-Pak ?Inject 0.3 mLs (0.3 mg total) into the muscle once as needed (allergic reaction). ?What changed: reasons to take this ?  ?ferrous sulfate 325 (65 FE) MG tablet ?Take 325 mg by mouth 3 (three) times daily with meals. ?  ?fexofenadine 180 MG tablet ?Commonly known as: ALLEGRA ?Take 180 mg by mouth daily. ?  ?fluticasone 50 MCG/ACT nasal spray ?Commonly known as: FLONASE ?Place 1 spray into the nose daily. ?  ?fluticasone-salmeterol 500-50 MCG/ACT Aepb ?Commonly known as: ADVAIR ?Inhale 1 puff into the lungs in the morning and at bedtime. ?  ?gabapentin 300 MG capsule ?Commonly known as: NEURONTIN ?Take 300 mg by mouth as needed (moderate pain). ?  ?HYDROcodone-acetaminophen 5-325 MG tablet ?Commonly known as: NORCO/VICODIN ?Take 1 tablet by mouth every 6 (six) hours  as needed for moderate pain. ?  ?ibuprofen 800 MG tablet ?Commonly known as: ADVIL ?Take 1 tablet (800 mg total) by mouth every 8 (eight) hours as needed for cramping. ?  ?ipratropium 0.03 % nasal spray

## 2022-02-04 NOTE — Progress Notes (Signed)
?PROGRESS NOTE ?Yvette Perry  BJS:[id number] DOB: 1975-10-25 DOA: 01/30/2022 ?PCP: Tempie Hoist, MD  ? ?Brief Narrative/Hospital Course: ?46 y.o. female with medical history significant for hypertension, anemia on iron supplement, diabetes on metformin, asthma, history of migraine, morbid obesity BMI 50 returned to the ED with worsening of pain and swelling on the right lateral chest wall, initially seen in the ED on 4/14-for a rash that started on her right lateral chest  and had spread and was painful  w/o known trauma ?ED Course: Hemodynamically stable afebrile.CT chest w/ contrast:' Stranding within the subcutaneous soft tissues of the right lateral lower chest and upper abdominal wall which could reflect hematoma or phlegmon/infection. No drainable abscess. No acute cardiopulmonary disease.  Placed on empiric antibiotic and admitted.  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  Wound culture with [facility]  ?  ?Subjective: ?Seen and examined this morning.  Overall patient feels much better decimated by surgery this morning ?Overnight no fever, BP stable WBC count stable, culture is still pending ? ?Assessment and Plan: ?Principal Problem: ?  Abscess of chest wall ?Active Problems: ?  Cellulitis ?  Migraine ?  Diabetes mellitus ([place]) ?  Asthma ?  Hypertension ?  Obesity, Class III, BMI 40-49.9 (morbid obesity) ([place]) ?  Hypokalemia ?  Normocytic anemia ? ?Cellulitis and abscess right lateral chest wall, failed outpatient Keflex: CT chest with no drainable fluid collection-consistent with cellulitis in ED. Admitted on iv antibiotics but had persistent swelling  induration -s/p  I&D of the abscess in OR 4/21, Gram stain [facility] - culture peNding.  MRSA PCR was negative.  Man needing Norco Motrin and IV morphine for pain control. Managed with iv vancomycin and ceftriaxone.surgery nurse to teach dressing care to patient and sister today.  Once culture final and patient comfortable with dressing change we will discharge her home ? ?Hx of Migraine: Continue home fiorecet with codeine ?  ?Diabetes mellitus not on long-term insulin: Borderline controlled A1c 7.4, blood sugar fairly stable, on SSI.  Holding metformin for now.   ?Recent Labs  ?Lab 02/01/22 ?6073 02/01/22 ?0729 02/02/22 ?2143 02/03/22 ?1111 02/03/22 ?1640 02/03/22 ?2112 02/04/22 ?7106  ?GLUCAP  --    < > 156* 130* 185* 160* 141*  ?HGBA1C 7.4*  --   --   --   --   --   --   ? < > = values in this interval not displayed.  ?   ?Asthma-not in exacerbation, continue her inhaler including-Dulera, as needed inhalers, Singulair ?  ?Hypertension: Was borderline controlled,so doubled her home HCTZ lisinopril and BP stable now ?  ?Morbid obesity/Class III: Will benefit with weight loss PC follow-up and healthy lifestyle. ?  ?Hypokalemia: improved. ?  ?Anemia hemoglobin at 10 g.  Overall stable likely chronic, with regular menstruation, she takes Megace as needed for heavy bleeding. Cont home iron supplement ?  ?DVT prophylaxis:  ?Code Status:   Code Status: Full Code ?Family Communication: plan of care discussed with patient at bedside. ?Patient status is: Inpatient level of care: Med-Surg  ?Remains inpatient because: Ongoing IV antibiotics ?Patient currently not stable ? ?Dispo: The patient is from:  home ?           Anticipated disposition: home  later today or tomorrow ? ?Mobility Assessment (last 72 hours)   ? ? Mobility Assessment   ? ? [place] Name 02/04/22 1000 02/04/22 0900 02/03/22 2128 02/03/22 1908 02/03/22 1300  ? Does patient have an order for bedrest or is patient medically unstable No - Continue assessment No - Continue  assessment No - Continue assessment No - Continue assessment No - Continue assessment  ? What is the highest level of mobility based on the progressive mobility assessment? Level 6 (Walks independently in room and hall) - Balance while walking in room without assist - Complete Level 6 (Walks independently in room and hall) - Balance while walking in room without assist - Complete Level 6 (Walks independently in room and hall) - Balance while walking in room without assist - Complete Level 6 (Walks independently in room and hall) - Balance while walking in room without assist - Complete Level 5 (Walks with assist in room/hall) - Balance while stepping forward/back and can walk in room with assist - Complete  ? ? [place] Name 02/02/22 2010 02/02/22 0930 02/01/22 1901  ?  ?  ? Does patient have an order for bedrest or is patient medically unstable No - Continue assessment No - Continue assessment No - Continue assessment    ? What is the highest level of mobility based on the progressive mobility assessment? Level 5 (Walks with assist in room/hall) - Balance while stepping forward/back and can walk in room with assist - Complete Level 6 (Walks independently in room and hall) - Balance while walking in room without assist - Complete Level 6 (Walks independently in room and hall) - Balance while walking in room without assist - Complete    ? ?  ?  ? ?  ?  ? ?Objective: ?Vitals last 24 hrs: ?Vitals:  ? 02/03/22 1812 02/03/22 2013 02/03/22 2114 02/04/22 0601  ?BP: (!) 153/91  122/71 135/87  ?Pulse: 86  83 74  ?Resp: '18  16 16  '$ ?Temp: 97.7 ?F (36.5 ?C)  98.4 ?F (36.9 ?C) 98 ?F (36.7 ?C)  ?TempSrc: Oral  Oral Oral  ?SpO2: 98% 98% 98% 97%  ?Weight:      ?Height:      ? ?Weight change:  ? ?Physical Examination: ?General exam: AA0X3,older than stated age, weak appearing. ?HEENT:Oral mucosa moist, Ear/Nose WNL grossly, dentition normal. ?Respiratory system: bilaterally CLEAR, RT CHEST WALL w/ dressing intact ?Cardiovascular system:  S1 & S2 +, No JVD,. ?Gastrointestinal system: Abdomen soft,NT,ND, BS+ ?Nervous System:Alert, awake, moving extremities and grossly nonfocal ?Extremities: edema neg,distal peripheral pulses palpable.  ?Skin: No rashes,no icterus. ?MSK: Normal muscle bulk,tone, power ? ? ?Medications reviewed: ?Scheduled Meds: ? ferrous sulfate  325 mg Oral BID WC  ? fluticasone  1 spray Each Nare Daily  ? lisinopril  40 mg Oral Daily  ? And  ? hydrochlorothiazide  25 mg Oral Daily  ? insulin aspart  0-5 Units Subcutaneous QHS  ? insulin aspart  0-9 Units Subcutaneous TID WC  ? ipratropium  2 spray Each Nare TID  ? loratadine  10 mg Oral Daily  ? mometasone-formoterol  2 puff Inhalation BID  ? montelukast  10 mg Oral QHS  ? pantoprazole  40 mg Oral Daily  ? ?Continuous Infusions: ? cefTRIAXone (ROCEPHIN)  IV Stopped (02/03/22 1030)  ? vancomycin Stopped (02/04/22 0134)  ? ? ? ?  ?Diet Order   ? ?       ?  Diet Carb Modified Fluid consistency: Thin; Room service appropriate? Yes  Diet effective now       ?  ? ?  ?  ? ?  ? ?Unresulted Labs (From admission, onward)  ? ?  Start     Ordered  ? 02/01/22 0854  Aerobic Culture w Gram Stain (superficial specimen)  Once,   R       ? 02/01/22 0853  ? ?  ?  ? ?  ?Data Reviewed: I have personally reviewed following labs and imaging studies ?CBC: ?Recent Labs  ?Lab Feb 12, 2022 ?1930 02/01/22 ?9924 02/02/22 ?2683 02/03/22 ?0410 02/04/22 ?4196  ?WBC 9.7 8.8 9.3 9.4 10.1  ?NEUTROABS 6.7  --   --   --   --   ?HGB 10.9* 10.1* 9.7* 10.8* 10.2*  ?HCT 35.5* 34.3* 32.7* 35.4* 34.8*  ?MCV 81.6 85.8 84.1 82.9 84.3  ?PLT 321 291 295 371 338  ? ?Basic Metabolic Panel: ?Recent Labs  ?Lab 02-12-22 ?1930 02/01/22 ?2229 02/02/22 ?7989 02/03/22 ?0410 02/04/22 ?2119  ?NA 137 137 139 135 137  ?K 3.1* 3.8 3.9 4.8 3.9  ?CL 100 101 104 100 102  ?CO2 '27 31 27 28 27  '$ ?GLUCOSE 190* 177* 189* 252* 174*  ?BUN 22* '17 16 15 '$ 28*  ?CREATININE 0.99 0.83 0.79 0.75 0.88  ?CALCIUM 8.7* 8.5* 8.5* 8.9 8.7*  ? ?GFR: ?Estimated Creatinine  Clearance: 115.8 mL/min (by C-G formula based on SCr of 0.88 mg/dL). ?Liver Function Tests: ?Recent Labs  ?Lab 02-12-2022 ?1930  ?AST 12*  ?ALT 14  ?ALKPHOS 96  ?BILITOT 0.5  ?PROT 7.2  ?ALBUMIN 3.0*  ? ?No re

## 2022-02-04 NOTE — Anesthesia Postprocedure Evaluation (Signed)
Anesthesia Post Note ? ?Patient: [name] ? ?Procedure(s) Performed: INCISION AND DRAINAGE ABSCESS RIGHT CHEST WALL (Right) ? ?  ? ?Patient location during evaluation: PACU ?Anesthesia Type: General ?Level of consciousness: awake and alert ?Pain management: pain level controlled ?Vital Signs Assessment: post-procedure vital signs reviewed and stable ?Respiratory status: spontaneous breathing, nonlabored ventilation and respiratory function stable ?Cardiovascular status: blood pressure returned to baseline and stable ?Postop Assessment: no apparent nausea or vomiting ?Anesthetic complications: no ? ? ?No notable events documented. ? ?Last Vitals:  ?Vitals:  ? 02/03/22 2114 02/04/22 0601  ?BP: 122/71 135/87  ?Pulse: 83 74  ?Resp: 16 16  ?Temp: 36.9 ?C 36.7 ?C  ?SpO2: 98% 97%  ?  ?Last Pain:  ?Vitals:  ? 02/04/22 0601  ?TempSrc: Oral  ?PainSc:   ? ? ?  ?  ?  ?  ?  ?  ? ?Darbie Biancardi,W. EDMOND ? ? ? ? ?

## 2022-02-05 DIAGNOSIS — L02213 Cutaneous abscess of chest wall: Secondary | ICD-10-CM | POA: Diagnosis not present

## 2022-02-05 LAB — GLUCOSE, CAPILLARY
Glucose-Capillary: 118 mg/dL — ABNORMAL HIGH (ref 70–99)
Glucose-Capillary: 110 mg/dL — ABNORMAL HIGH (ref 70–99)

## 2022-02-05 MED ORDER — HYDROCODONE-ACETAMINOPHEN 5-325 MG PO TABS: 1.0000 | ORAL_TABLET | Freq: Four times a day (QID) | ORAL | 0 refills | Status: DC | PRN

## 2022-02-05 MED ORDER — LISINOPRIL-HYDROCHLOROTHIAZIDE 20-12.5 MG PO TABS: 2.0000 | ORAL_TABLET | Freq: Every day | ORAL | 0 refills | Status: AC

## 2022-02-05 MED ORDER — DOXYCYCLINE HYCLATE 50 MG PO CAPS
50.0000 mg | ORAL_CAPSULE | Freq: Two times a day (BID) | ORAL | 0 refills | Status: AC
Start: 2022-02-05 — End: 2022-02-12

## 2022-02-05 NOTE — Plan of Care (Signed)
  Problem: Clinical Measurements: Goal: Ability to avoid or minimize complications of infection will improve Outcome: Adequate for Discharge   Problem: Skin Integrity: Goal: Skin integrity will improve Outcome: Adequate for Discharge   Problem: Education: Goal: Knowledge of General Education information will improve Description: Including pain rating scale, medication(s)/side effects and non-pharmacologic comfort measures Outcome: Adequate for Discharge   Problem: Health Behavior/Discharge Planning: Goal: Ability to manage health-related needs will improve Outcome: Adequate for Discharge   Problem: Clinical Measurements: Goal: Ability to maintain clinical measurements within normal limits will improve Outcome: Adequate for Discharge Goal: Will remain free from infection Outcome: Adequate for Discharge Goal: Diagnostic test results will improve Outcome: Adequate for Discharge Goal: Respiratory complications will improve Outcome: Adequate for Discharge Goal: Cardiovascular complication will be avoided Outcome: Adequate for Discharge   Problem: Activity: Goal: Risk for activity intolerance will decrease Outcome: Adequate for Discharge   Problem: Nutrition: Goal: Adequate nutrition will be maintained Outcome: Adequate for Discharge   Problem: Coping: Goal: Level of anxiety will decrease Outcome: Adequate for Discharge   Problem: Elimination: Goal: Will not experience complications related to bowel motility Outcome: Adequate for Discharge Goal: Will not experience complications related to urinary retention Outcome: Adequate for Discharge   Problem: Pain Managment: Goal: General experience of comfort will improve Outcome: Adequate for Discharge   Problem: Safety: Goal: Ability to remain free from injury will improve Outcome: Adequate for Discharge   Problem: Skin Integrity: Goal: Risk for impaired skin integrity will decrease Outcome: Adequate for Discharge   

## 2022-02-05 NOTE — Progress Notes (Signed)
Patient and sister were given discharge instructions, sister is a Therapist, sports and she will be doing the wet to dry dressing changes on patient. She did correctly explain to me how to dress the wound. All questions were answered. Patient was taken to main entrance via wheelchair. ?

## 2022-02-07 LAB — AEROBIC/ANAEROBIC CULTURE W GRAM STAIN (SURGICAL/DEEP WOUND): Gram Stain: NONE SEEN

## 2022-02-28 ENCOUNTER — Encounter: Payer: Self-pay | Admitting: Obstetrics & Gynecology

## 2022-02-28 ENCOUNTER — Ambulatory Visit (INDEPENDENT_AMBULATORY_CARE_PROVIDER_SITE_OTHER): Payer: Managed Care, Other (non HMO) | Admitting: Obstetrics & Gynecology

## 2022-02-28 ENCOUNTER — Other Ambulatory Visit (HOSPITAL_COMMUNITY)
Admission: RE | Admit: 2022-02-28 | Discharge: 2022-02-28 | Disposition: A | Discharge status: Home / Self Care | Payer: Managed Care, Other (non HMO) | Source: Ambulatory Visit | Attending: Obstetrics & Gynecology | Admitting: Obstetrics & Gynecology

## 2022-02-28 VITALS — BP 148/95 | HR 93 | Ht 66.0 in | Wt 298.0 lb

## 2022-02-28 DIAGNOSIS — Z01419 Encounter for gynecological examination (general) (routine) without abnormal findings: Secondary | ICD-10-CM | POA: Diagnosis not present

## 2022-02-28 DIAGNOSIS — N898 Other specified noninflammatory disorders of vagina: Secondary | ICD-10-CM | POA: Diagnosis not present

## 2022-02-28 DIAGNOSIS — B3731 Acute candidiasis of vulva and vagina: Secondary | ICD-10-CM

## 2022-02-28 DIAGNOSIS — Z113 Encounter for screening for infections with a predominantly sexual mode of transmission: Secondary | ICD-10-CM | POA: Diagnosis not present

## 2022-02-28 MED ORDER — FLUCONAZOLE 150 MG PO TABS: 150.0000 mg | ORAL_TABLET | ORAL | 0 refills | Status: DC

## 2022-02-28 NOTE — Progress Notes (Signed)
Patient complaining of periods that have become irregular - "lasting longer than they should" ?Patient was recently in hospital for cellulitis. Patient is complaining of vagina itching/ chapped feeling. ?

## 2022-02-28 NOTE — Progress Notes (Signed)
Subjective:  ?  ? Yvette Perry is a 46 y.o. female here for a routine exam.  Current complaints: Pt reports a heavy cycle after begin admitted to the hosp with cellulitis. She was on multiple atbx for an abscess on her right side. Since discharge she has had lots of vulvar and vaginal itching. Pt requests STI screen.  ? ?  ?Gynecologic History ?No LMP recorded. (Menstrual status: IUD). ?Contraception: tubal ligation ?Last Pap: 10/24/2018. Results were: normal ?Last mammogram: 07/2021. Results were: normal. She gets these annually at work at the [employer]   ? ?Obstetric History ?OB History  ?Gravida Para Term Preterm AB Living  ?'4 4 3 1   4  '$ ?SAB IAB Ectopic Multiple Live Births  ?      1 4  ?  ?# Outcome Date GA Lbr Len/2nd Weight Sex Delivery Anes PTL Lv  ?4A Preterm 07/12/03 [redacted]w[redacted]d  M CS-LTranv Spinal  LIV  ?4B Preterm 07/12/03 340w0d M CS-LTranv Spinal    ?3 Term 2002 394w0dF Vag-Spont None  LIV  ?2 Term [id number] Vag-Spont   LIV  ?1 Term [id number]-Spont   LIV  ? ? ? ?The following portions of the patient's history were reviewed and updated as appropriate: allergies, current medications, past family history, past medical history, past social history, past surgical history, and problem list. ? ?Review of Systems ?Pertinent items are noted in HPI.  ?  ?Objective:  ?BP (!) 148/95   Pulse 93   Ht '5\' 6"'$  (1.676 m)   Wt 298 lb (135.2 kg)   BMI 48.10 kg/m?  ? ?General Appearance:    Alert, cooperative, no distress, appears stated age  ?Head:    Normocephalic, without obvious abnormality, atraumatic  ?Eyes:    conjunctiva/corneas clear, EOM's intact, both eyes  ?Ears:    Normal external ear canals, both ears  ?Nose:   Nares normal, septum midline, mucosa normal, no drainage    or sinus tenderness  ?Throat:   Lips, mucosa, and tongue normal; teeth and gums normal  ?Neck:   Supple, symmetrical, trachea midline, no adenopathy;  ?  thyroid:  no enlargement/tenderness/nodules  ?Back:      Symmetric, no curvature, ROM normal, no CVA tenderness  ?Lungs:     respirations unlabored  ?Chest Wall:    No tenderness or deformity  ? Heart:    Regular rate and rhythm  ?Breast Exam:    No tenderness, masses, or nipple abnormality  ?Abdomen:     Soft, non-tender, bowel sounds active all four quadrants,  ?  no masses, no organomegaly  ?Genitalia:    Normal female without lesion, discharge or tenderness  ? There are satellite lesions and white curd like discharge c/w yeast on the upper thigh, the vulva and the vagina.   ?Extremities:   Extremities normal, atraumatic, no cyanosis or edema  ?Pulses:   2+ and symmetric all extremities  ?Skin:   Skin color, texture, turgor normal, no rashes or lesions  ?  ?Assessment:  ? ? Healthy female exam.  ?Yeast vaginitis.  ?  ?Plan:  ? ?Diagnoses and all orders for this visit: ? ?Well woman exam with routine gynecological exam ?-     Cytology - PAP( [place]) ? ?Vaginal itching ?-     Cervicovaginal ancillary only( [place]) ? ?Screening for STD (sexually transmitted disease) ?-     Cervicovaginal ancillary only( [place]) ? ?  Vulvovaginitis due to yeast ?-     fluconazole (DIFLUCAN) 150 MG tablet; Take 1 tablet (150 mg total) by mouth every 3 (three) days. For three doses ? ? F/u in 1 year or sooner prn  ? ?Yvette Perry, M.D., [place] ? ?

## 2022-03-02 LAB — CERVICOVAGINAL ANCILLARY ONLY
Bacterial Vaginitis (gardnerella): NEGATIVE
Candida Glabrata: NEGATIVE
Candida Vaginitis: NEGATIVE
Chlamydia: NEGATIVE
Comment: NEGATIVE
Comment: NEGATIVE
Comment: NEGATIVE
Comment: NEGATIVE
Comment: NEGATIVE
Comment: NORMAL
Neisseria Gonorrhea: NEGATIVE
Trichomonas: POSITIVE — AB

## 2022-03-02 LAB — CYTOLOGY - PAP
Comment: NEGATIVE
Diagnosis: NEGATIVE
High risk HPV: NEGATIVE

## 2022-03-05 ENCOUNTER — Telehealth: Payer: Self-pay

## 2022-03-05 DIAGNOSIS — A599 Trichomoniasis, unspecified: Secondary | ICD-10-CM

## 2022-03-05 MED ORDER — METRONIDAZOLE 500 MG PO TABS: 500.0000 mg | ORAL_TABLET | Freq: Two times a day (BID) | ORAL | 0 refills | Status: DC

## 2022-03-05 NOTE — Telephone Encounter (Signed)
Called pt to inform her of positive Trich results. Advised pt to have partner treated and to not have sex for at least 7 days after she and partner have been treated. Flagyl 500 mg 1 tablet BID x 7 days was sent to her pharmacy. STD form was faxed to [facility] . Understanding was voiced. Burr Soffer l Ocie Tino, CMA

## 2022-03-09 ENCOUNTER — Other Ambulatory Visit: Payer: Self-pay | Admitting: Obstetrics & Gynecology

## 2022-03-13 ENCOUNTER — Ambulatory Visit: Payer: Managed Care, Other (non HMO) | Admitting: Neurology

## 2022-05-02 ENCOUNTER — Telehealth: Payer: Self-pay

## 2022-05-02 DIAGNOSIS — N939 Abnormal uterine and vaginal bleeding, unspecified: Secondary | ICD-10-CM

## 2022-05-02 MED ORDER — MEGESTROL ACETATE 40 MG PO TABS: 40.0000 mg | ORAL_TABLET | Freq: Two times a day (BID) | ORAL | 0 refills | Status: DC

## 2022-05-02 NOTE — Telephone Encounter (Signed)
Patient needs refills on her megace. Been bleeding for two and half weeks.  Dr. Ihor Dow reviewed patient complaints and then asked for ultrasound to be ordered. Patient can also have one month refill of megace. [facility]

## 2022-05-02 NOTE — Telephone Encounter (Signed)
-----   Message from Maurine Minister, [place] sent at 05/02/2022  2:29 PM EDT ----- Regarding: Revised (Request Call Back)  ----- Message ----- From: Maurine Minister, NT Sent: 05/02/2022   2:28 PM EDT To: [facility]  Patient bleeding 2 to 2 1/2 weeks.  Would like to speak with a nurse about a Rx.

## 2022-05-03 ENCOUNTER — Ambulatory Visit (HOSPITAL_BASED_OUTPATIENT_CLINIC_OR_DEPARTMENT_OTHER)
Admission: RE | Admit: 2022-05-03 | Discharge: 2022-05-03 | Disposition: A | Discharge status: Home / Self Care | Payer: Managed Care, Other (non HMO) | Source: Ambulatory Visit | Attending: Obstetrics & Gynecology | Admitting: Obstetrics & Gynecology

## 2022-05-03 ENCOUNTER — Other Ambulatory Visit: Payer: Self-pay | Admitting: Obstetrics & Gynecology

## 2022-05-03 DIAGNOSIS — N939 Abnormal uterine and vaginal bleeding, unspecified: Secondary | ICD-10-CM | POA: Diagnosis not present

## 2022-05-16 ENCOUNTER — Telehealth: Payer: Self-pay

## 2022-05-16 NOTE — Telephone Encounter (Signed)
Called pt. Pt made aware that per Dr. Lorretta Harp IUD is NOT seen on the [place]. She does have a fibroid in the lining of the uterus. Is she still bleeding. Pt is scheduled for f/u via Mychart on 05/30/22. Understanding was voiced. Nare Gaspari l Lilly Gasser, CMA

## 2022-05-16 NOTE — Telephone Encounter (Signed)
-----   Message from Lavonia Drafts, MD sent at 05/15/2022 11:16 AM EDT ----- Please call pt. Her IUD is NOT seen on the [place]. She does have a fibroid in the lining of the uterus. Is she still bleeding.   She can make a f/u visit to discuss management options. This can be virtual or in person.   Thx,  [facility]-S

## 2022-05-30 ENCOUNTER — Encounter: Payer: Self-pay | Admitting: Obstetrics & Gynecology

## 2022-05-30 ENCOUNTER — Telehealth (INDEPENDENT_AMBULATORY_CARE_PROVIDER_SITE_OTHER): Payer: Managed Care, Other (non HMO) | Admitting: Obstetrics & Gynecology

## 2022-05-30 VITALS — Ht 66.0 in | Wt 298.0 lb

## 2022-05-30 DIAGNOSIS — T8332XD Displacement of intrauterine contraceptive device, subsequent encounter: Secondary | ICD-10-CM | POA: Diagnosis not present

## 2022-05-30 DIAGNOSIS — N939 Abnormal uterine and vaginal bleeding, unspecified: Secondary | ICD-10-CM | POA: Diagnosis not present

## 2022-05-30 DIAGNOSIS — D251 Intramural leiomyoma of uterus: Secondary | ICD-10-CM | POA: Diagnosis not present

## 2022-05-30 NOTE — Progress Notes (Signed)
GYNECOLOGY VIRTUAL VISIT ENCOUNTER NOTE  Provider location: [facility]   Patient location: Home  I connected with Yvette Perry on 05/30/22 at  1:10 PM EDT by MyChart Video Encounter and verified that I am speaking with the correct person using two identifiers.   I discussed the limitations, risks, security and privacy concerns of performing an evaluation and management service virtually and the availability of in person appointments. I also discussed with the patient that there may be a patient responsible charge related to this service. The patient expressed understanding and agreed to proceed.   History:  Yvette Perry is a 46 y.o. [phone] female being evaluated today for AUB.  Pt had a lost IUD strings. She is concerned about where the IUD is. She denies any abnormal vaginal discharge, pelvic pain or other concerns.       Past Medical History:  Diagnosis Date   Abnormal Pap smear of cervix    LGSIL   Asthma    Diabetes mellitus ([place])    Goiter    Hypertension    Migraine    Pre-diabetes    Uterine fibroid    Past Surgical History:  Procedure Laterality Date   CESAREAN SECTION  06/2003   COLPOSCOPY  12/22/2008   normal   ENDOMETRIAL BIOPSY  06/30/2013   benign   INCISION AND DRAINAGE ABSCESS Right 02/02/2022   Procedure: INCISION AND DRAINAGE ABSCESS RIGHT CHEST WALL;  Surgeon: Autumn Messing III, MD;  Location: [facility];  Service: General;  Laterality: Right;  LATERAL/PT ATE AT 0930   TUBAL LIGATION  06/2003   The following portions of the patient's history were reviewed and updated as appropriate: allergies, current medications, past family history, past medical history, past social history, past surgical history and problem list.   Health Maintenance:  Normal pap and negative HRHPV on 02/28/2022.  Normal mammogram on 07/2020.   Review of Systems:  Pertinent items noted in HPI and remainder of comprehensive ROS otherwise  negative.  Physical Exam:   General:  Alert, oriented and cooperative. Patient appears to be in no acute distress.  Mental Status: Normal mood and affect. Normal behavior. Normal judgment and thought content.   Respiratory: Normal respiratory effort, no problems with respiration noted  Rest of physical exam deferred due to type of encounter  Labs and Imaging No results found for this or any previous visit (from the past 336 hour(s)). US PELVIC COMPLETE WITH TRANSVAGINAL  Result Date: 05/03/2022 CLINICAL DATA:  Abnormal uterine bleeding EXAM: TRANSABDOMINAL AND TRANSVAGINAL ULTRASOUND OF PELVIS TECHNIQUE: Both transabdominal and transvaginal ultrasound examinations of the pelvis were performed. Transabdominal technique was performed for global imaging of the pelvis including uterus, ovaries, adnexal regions, and pelvic cul-de-sac. It was necessary to proceed with endovaginal exam following the transabdominal exam to visualize the endometrium and ovaries. COMPARISON:  None Available. FINDINGS: Uterus Measurements: 8.9 x 5.5 x 6.1 cm = volume: 154.9 mL. There is inhomogeneous echogenicity in myometrium. There is 1.5 x 1.2 cm hypoechoic structure in the fundus, possibly fibroid. Endometrium Thickness: 3.9 mm.  IUD is not seen in the endometrial cavity. Right ovary Measurements: 4.6 x 3.2 x 2.8 cm = volume: 21.2 mL. There is a 6 x 7 mm hyperechoic focus, possibly a small dermoid. Left ovary Not sonographically visualized. Other findings No abnormal free fluid. IMPRESSION: There is inhomogeneous echogenicity in myometrium with 1.5 cm fibroid in the fundus. IUD is not visualized in the uterine cavity.  Left ovary is not sonographically visualized. Possible 7 mm dermoid is seen in right ovary. Pelvic sonogram is otherwise unremarkable. Electronically Signed   By: Elmer Picker M.D.   On: 05/03/2022 15:34       Assessment and Plan:     There are no diagnoses linked to this encounter.      I discussed  the assessment and treatment plan with the patient. The patient was provided an opportunity to ask questions and all were answered. The patient agreed with the plan and demonstrated an understanding of the instructions.   The patient was advised to call back or seek an in-person evaluation/go to the ED if the symptoms worsen or if the condition fails to improve as anticipated.  Patient desires surgical management with hysteroscopy with endometrial ablation.  The risks of surgery were discussed in detail with the patient including but not limited to: bleeding which may require transfusion or reoperation; infection which may require prolonged hospitalization or re-hospitalization and antibiotic therapy; injury to bowel, bladder, ureters and major vessels or other surrounding organs; need for additional procedures including laparotomy; thromboembolic phenomenon, incisional problems and other postoperative or anesthesia complications.  Patient was told that the likelihood that her condition and symptoms will be treated effectively with this surgical management was very high; the postoperative expectations were also discussed in detail. The patient also understands the alternative treatment options which were discussed in full. All questions were answered.  She was told that she will be contacted by our surgical scheduler regarding the time and date of her surgery; routine preoperative instructions of having nothing to eat or drink after midnight on the day prior to surgery and also coming to the hospital 1 1/2 hours prior to her time of surgery were also emphasized.  She was told she may be called for a preoperative appointment about a week prior to surgery and will be given further preoperative instructions at that visit. Printed patient education handouts about the procedure were given to the patient to review at home.   Total face-to-face time with patient, review of chart, discussion with consultant and  coordination of care was 79mn.     CLavonia Drafts MD [facility] [place]

## 2022-05-30 NOTE — Patient Instructions (Signed)
Endometrial Ablation, Care After The following information offers guidance on how to care for yourself after your procedure. Your health care provider may also give you more specific instructions. If you have problems or questions, contact your health care provider. What can I expect after the procedure? After the procedure, it is common to have: A need to urinate more often than usual for the first 24 hours. Cramps that feel like menstrual cramps. These may last for 1-2 days. A thin, watery vaginal discharge that is light pink or brown. This may last for a few weeks. Discharge will be heavy for the first few days after your procedure. You may need to wear a sanitary pad. Nausea. Vaginal bleeding for 4-6 weeks after the procedure, as tissue healing occurs. Follow these instructions at home: Medicines  Take over-the-counter and prescription medicines only as told by your health care provider. If you were prescribed an antibiotic medicine, take it as told by your health care provider. Do not stop taking the antibiotic even if you start to feel better. Ask your health care provider if the medicine prescribed to you: Requires you to avoid driving or using machinery. Can cause constipation. You may need to take these actions to prevent or treat constipation: Drink enough fluid to keep your urine pale yellow. Take over-the-counter or prescription medicines. Eat foods that are high in fiber, such as beans, whole grains, and fresh fruits and vegetables. Limit foods that are high in fat and processed sugars, such as fried or sweet foods. Activity If you were given a sedative during the procedure, it can affect you for several hours. Do not drive or operate machinery until your health care provider says that it is safe. Do not have sex or put anything into your vagina until your health care provider says that it is safe. Do not lift anything that is heavier than 5 lb (2.3 kg), or the limit that you are  told, until your health care provider says that it is safe. Return to your normal activities as told by your health care provider. Ask your health care provider what activities are safe for you. General instructions Do not take baths, swim, or use a hot tub until your health care provider says that it is safe. You will be able to take showers. Check your vaginal area every day for signs of infection. Check for: Redness, swelling, or more pain. More blood coming from your vagina. A bad-smelling discharge. Keep all follow-up visits. This is important. Contact a health care provider if: You have vaginal redness, swelling, or more pain. You have discharge or bleeding from your vagina that is getting worse. You have a bad-smelling vaginal discharge. You have a fever or chills. You have trouble urinating. Get help right away if: You have heavy, bright red vaginal bleeding that may include blood clots. You have severe cramps that do not get better with medicine. Summary After endometrial ablation, it is normal to have a thin, watery vaginal discharge that is light pink or brown. This may last a few weeks and may be heavier right after the procedure. Vaginal bleeding is common after the procedure and should get better with time. Check your vaginal area every day for signs of infection, such as a bad-smelling discharge. Keep all follow-up visits. This is important. This information is not intended to replace advice given to you by your health care provider. Make sure you discuss any questions you have with your health care provider. Document Revised: 04/21/2020  Document Reviewed: 04/21/2020 Elsevier Patient Education  [place] After The following information offers guidance on how to care for yourself after your procedure. Your health care provider may also give you more specific instructions. If you have problems or questions, contact your health care  provider. What can I expect after the procedure? After the procedure, it is common to have: A need to urinate more often than usual for the first 24 hours. Cramps that feel like menstrual cramps. These may last for 1-2 days. A thin, watery vaginal discharge that is light pink or brown. This may last for a few weeks. Discharge will be heavy for the first few days after your procedure. You may need to wear a sanitary pad. Nausea. Vaginal bleeding for 4-6 weeks after the procedure, as tissue healing occurs. Follow these instructions at home: Medicines  Take over-the-counter and prescription medicines only as told by your health care provider. If you were prescribed an antibiotic medicine, take it as told by your health care provider. Do not stop taking the antibiotic even if you start to feel better. Ask your health care provider if the medicine prescribed to you: Requires you to avoid driving or using machinery. Can cause constipation. You may need to take these actions to prevent or treat constipation: Drink enough fluid to keep your urine pale yellow. Take over-the-counter or prescription medicines. Eat foods that are high in fiber, such as beans, whole grains, and fresh fruits and vegetables. Limit foods that are high in fat and processed sugars, such as fried or sweet foods. Activity If you were given a sedative during the procedure, it can affect you for several hours. Do not drive or operate machinery until your health care provider says that it is safe. Do not have sex or put anything into your vagina until your health care provider says that it is safe. Do not lift anything that is heavier than 5 lb (2.3 kg), or the limit that you are told, until your health care provider says that it is safe. Return to your normal activities as told by your health care provider. Ask your health care provider what activities are safe for you. General instructions Do not take baths, swim, or use a hot  tub until your health care provider says that it is safe. You will be able to take showers. Check your vaginal area every day for signs of infection. Check for: Redness, swelling, or more pain. More blood coming from your vagina. A bad-smelling discharge. Keep all follow-up visits. This is important. Contact a health care provider if: You have vaginal redness, swelling, or more pain. You have discharge or bleeding from your vagina that is getting worse. You have a bad-smelling vaginal discharge. You have a fever or chills. You have trouble urinating. Get help right away if: You have heavy, bright red vaginal bleeding that may include blood clots. You have severe cramps that do not get better with medicine. Summary After endometrial ablation, it is normal to have a thin, watery vaginal discharge that is light pink or brown. This may last a few weeks and may be heavier right after the procedure. Vaginal bleeding is common after the procedure and should get better with time. Check your vaginal area every day for signs of infection, such as a bad-smelling discharge. Keep all follow-up visits. This is important. This information is not intended to replace advice given to you by your health care provider. Make sure you discuss any  questions you have with your health care provider. Document Revised: 04/21/2020 Document Reviewed: 04/21/2020 Elsevier Patient Education  [place]. Vaginal Hysterectomy  A vaginal hysterectomy is a procedure to remove all or part of the uterus through a small incision in the vagina. In this procedure, your health care provider may remove your entire uterus, including the cervix. The cervix is the opening and bottom part of the uterus and is located between the vagina and the uterus. Sometimes, the ovaries and fallopian tubes are also removed. This surgery may be done to treat problems such as: Noncancerous growths in the uterus (uterine fibroids) that cause  symptoms. A condition that causes the lining of the uterus to grow in other areas (endometriosis). Problems with pelvic support. Cancer of the cervix, ovaries, uterus, or tissue that lines the uterus (endometrium). Excessive bleeding in the uterus. When removing your uterus, your health care provider may also remove the ovaries and the fallopian tubes. After this procedure, you will no longer be able to have a baby, and you will no longer have a menstrual period. Tell a health care provider about: Any allergies you have. All medicines you are taking, including vitamins, herbs, eye drops, creams, and over-the-counter medicines. Any problems you or family members have had with anesthetic medicines. Any blood disorders you have. Any surgeries you have had. Any medical conditions you have. Whether you are pregnant or may be pregnant. What are the risks? Generally, this is a safe procedure. However, problems may occur, including: Bleeding. Infection. Blood clots in the legs or lungs. Damage to nearby structures or organs. Pain during sex. Allergic reactions to medicines. What happens before the procedure? Staying hydrated Follow instructions from your health care provider about hydration, which may include: Up to 2 hours before the procedure - you may continue to drink clear liquids, such as water, clear fruit juice, black coffee, and plain tea.  Eating and drinking restrictions Follow instructions from your health care provider about eating and drinking, which may include: 8 hours before the procedure - stop eating heavy meals or foods, such as meat, fried foods, or fatty foods. 6 hours before the procedure - stop eating light meals or foods, such as toast or cereal. 6 hours before the procedure - stop drinking milk or drinks that contain milk. 2 hours before the procedure - stop drinking clear liquids. Medicines Take over-the-counter and prescription medicines only as told by your  health care provider. You may be asked to take a medicine to empty your colon (bowel preparation). General instructions If you were asked to do a bowel preparation before the procedure, follow instructions from your health care provider. This procedure can affect the way you feel about yourself. Talk with your health care provider about the physical and emotional changes hysterectomy may cause. Do not use any products that contain nicotine or tobacco for at least 4 weeks before the procedure. These products include cigarettes, e-cigarettes, and chewing tobacco. If you need help quitting, ask your health care provider. Plan to have a responsible adult take you home from the hospital or clinic. Plan to have a responsible adult care for you for the time you are told after you leave the hospital or clinic. This is important. Surgery safety Ask your health care provider: How your surgery site will be marked. What steps will be taken to help prevent infection. These may include: Removing hair at the surgery site. Washing skin with a germ-killing soap. Receiving antibiotic medicine. What happens during  the procedure? An IV will be inserted into one of your veins. You will be given one or more of the following: A medicine to help you relax (sedative). A medicine to numb the area (local anesthetic). A medicine to make you fall asleep (general anesthetic). A medicine that is injected into your spine to numb the area below and slightly above the injection site (spinal anesthetic). A medicine that is injected into an area of your body to numb everything below the injection site (regional anesthetic). Your surgeon will make an incision in your vagina. Your surgeon will locate and remove all or part of your uterus. Part or all of the uterus will be removed through the vagina. Your ovaries and fallopian tubes may be removed at the same time. The incision in your vagina will be closed with stitches  (sutures) that dissolve over time. The procedure may vary among health care providers and hospitals. What happens after the procedure? Your blood pressure, heart rate, breathing rate, and blood oxygen level will be monitored until you leave the hospital or clinic. You will be encouraged to walk as soon as possible. You will also use a device or do breathing exercises to keep your lungs clear. You may have to wear compression stockings. These stockings help to prevent blood clots and reduce swelling in your legs. You will be given pain medicine as needed. You will need to wear a sanitary pad for vaginal discharge or bleeding. Summary A vaginal hysterectomy is a procedure to remove all or part of the uterus through the vagina. You may need a vaginal hysterectomy to treat a variety of abnormalities of the uterus. Plan to have a responsible adult take you home from the hospital or clinic. Plan to have a responsible adult care for you for the time you are told after you leave the hospital or clinic. This is important. This information is not intended to replace advice given to you by your health care provider. Make sure you discuss any questions you have with your health care provider. Document Revised: 12/13/2021 Document Reviewed: 06/03/2020 Elsevier Patient Education  [place].

## 2022-06-26 NOTE — Progress Notes (Signed)
Spoke with patient on the phone and she stated she was planning on re-scheduling the procedure. Patient verbalized understanding that she would need to call Dr Vladimir Creeks office and speak to her surgery scheduler, patient given office phone number.

## 2022-07-02 ENCOUNTER — Telehealth: Payer: Self-pay

## 2022-07-02 NOTE — Telephone Encounter (Signed)
Called patient to confirm, she wanted to reschedule her surgery, no answer, voice mailbox is full unable to leave message. Surgery cancelled, patient can give the office a call if she would like to reschedule.

## 2022-07-02 NOTE — Telephone Encounter (Signed)
-----   Message from Derrek Monaco, RN sent at 07/02/2022 11:49 AM EDT ----- Patient states she is calling office to reschedule surgery

## 2022-07-17 ENCOUNTER — Ambulatory Visit (HOSPITAL_BASED_OUTPATIENT_CLINIC_OR_DEPARTMENT_OTHER)
Admission: RE | Admit: 2022-07-17 | Payer: Managed Care, Other (non HMO) | Source: Home / Self Care | Admitting: Obstetrics & Gynecology

## 2022-07-17 SURGERY — DILATATION & CURETTAGE/HYSTEROSCOPY WITH NOVASURE ABLATION
Anesthesia: Choice

## 2022-09-11 ENCOUNTER — Encounter (HOSPITAL_BASED_OUTPATIENT_CLINIC_OR_DEPARTMENT_OTHER): Payer: Self-pay | Admitting: Obstetrics & Gynecology

## 2022-09-11 NOTE — Progress Notes (Addendum)
Spoke w/ via phone for pre-op interview--- [place]---- EKG,UPT and ISTAT per anesthesia. Surgeon orders pending              Lab results------ COVID test -----patient states asymptomatic no test needed Arrive at -------1000 NPO after MN NO Solid Food.  Clear liquids from MN until---0900 Med rec completed Medications to take morning of surgery -----Advair Diabetic medication ----- None DM medication AM of surgery, patient verbalized understanding. Patient instructed no nail polish to be worn day of surgery Patient instructed to bring photo id and insurance card day of surgery Patient aware to have Driver (ride ) / caregiver  Yvette Perry sister  for 24 hours after surgery  Patient Special Instructions ----- Pre-Op special Istructions ----- Patient verbalized understanding of instructions that were given at this phone interview. Patient denies shortness of breath, chest pain, fever, cough at this phone interview.

## 2022-09-18 ENCOUNTER — Encounter (HOSPITAL_BASED_OUTPATIENT_CLINIC_OR_DEPARTMENT_OTHER): Payer: Self-pay | Admitting: Obstetrics & Gynecology

## 2022-09-18 ENCOUNTER — Other Ambulatory Visit: Payer: Self-pay

## 2022-09-18 ENCOUNTER — Ambulatory Visit (HOSPITAL_BASED_OUTPATIENT_CLINIC_OR_DEPARTMENT_OTHER): Payer: Managed Care, Other (non HMO) | Admitting: Anesthesiology

## 2022-09-18 ENCOUNTER — Encounter (HOSPITAL_BASED_OUTPATIENT_CLINIC_OR_DEPARTMENT_OTHER)
Admission: RE | Disposition: A | Discharge status: Home / Self Care | Payer: Self-pay | Source: Home / Self Care | Attending: Obstetrics & Gynecology

## 2022-09-18 ENCOUNTER — Ambulatory Visit (HOSPITAL_BASED_OUTPATIENT_CLINIC_OR_DEPARTMENT_OTHER)
Admission: RE | Admit: 2022-09-18 | Discharge: 2022-09-18 | Disposition: A | Discharge status: Home / Self Care | Payer: Managed Care, Other (non HMO) | Attending: Obstetrics & Gynecology | Admitting: Obstetrics & Gynecology

## 2022-09-18 DIAGNOSIS — Z7984 Long term (current) use of oral hypoglycemic drugs: Secondary | ICD-10-CM

## 2022-09-18 DIAGNOSIS — J45909 Unspecified asthma, uncomplicated: Secondary | ICD-10-CM | POA: Insufficient documentation

## 2022-09-18 DIAGNOSIS — E876 Hypokalemia: Secondary | ICD-10-CM | POA: Insufficient documentation

## 2022-09-18 DIAGNOSIS — E119 Type 2 diabetes mellitus without complications: Secondary | ICD-10-CM

## 2022-09-18 DIAGNOSIS — N930 Postcoital and contact bleeding: Secondary | ICD-10-CM

## 2022-09-18 DIAGNOSIS — N938 Other specified abnormal uterine and vaginal bleeding: Secondary | ICD-10-CM | POA: Insufficient documentation

## 2022-09-18 DIAGNOSIS — I10 Essential (primary) hypertension: Secondary | ICD-10-CM

## 2022-09-18 DIAGNOSIS — N939 Abnormal uterine and vaginal bleeding, unspecified: Secondary | ICD-10-CM | POA: Diagnosis not present

## 2022-09-18 DIAGNOSIS — D649 Anemia, unspecified: Secondary | ICD-10-CM | POA: Insufficient documentation

## 2022-09-18 DIAGNOSIS — G43909 Migraine, unspecified, not intractable, without status migrainosus: Secondary | ICD-10-CM | POA: Insufficient documentation

## 2022-09-18 DIAGNOSIS — Z01818 Encounter for other preprocedural examination: Secondary | ICD-10-CM

## 2022-09-18 DIAGNOSIS — Z6841 Body Mass Index (BMI) 40.0 and over, adult: Secondary | ICD-10-CM | POA: Insufficient documentation

## 2022-09-18 HISTORY — PX: DILITATION & CURRETTAGE/HYSTROSCOPY WITH NOVASURE ABLATION: SHX5568

## 2022-09-18 LAB — POCT I-STAT, CHEM 8
BUN: 11 mg/dL (ref 6–20)
Calcium, Ion: 1.1 mmol/L — ABNORMAL LOW (ref 1.15–1.40)
Chloride: 103 mmol/L (ref 98–111)
Creatinine, Ser: 0.7 mg/dL (ref 0.44–1.00)
Glucose, Bld: 142 mg/dL — ABNORMAL HIGH (ref 70–99)
HCT: 38 % (ref 36.0–46.0)
Hemoglobin: 12.9 g/dL (ref 12.0–15.0)
Potassium: 3.7 mmol/L (ref 3.5–5.1)
Sodium: 139 mmol/L (ref 135–145)
TCO2: 25 mmol/L (ref 22–32)

## 2022-09-18 LAB — CBC
HCT: 37.5 % (ref 36.0–46.0)
Hemoglobin: 11.4 g/dL — ABNORMAL LOW (ref 12.0–15.0)
MCH: 26.1 pg (ref 26.0–34.0)
MCHC: 30.4 g/dL (ref 30.0–36.0)
MCV: 85.8 fL (ref 80.0–100.0)
Platelets: 324 10*3/uL (ref 150–400)
RBC: 4.37 MIL/uL (ref 3.87–5.11)
RDW: 15.6 % — ABNORMAL HIGH (ref 11.5–15.5)
WBC: 8.3 10*3/uL (ref 4.0–10.5)
nRBC: 0 % (ref 0.0–0.2)

## 2022-09-18 LAB — POCT PREGNANCY, URINE: Preg Test, Ur: NEGATIVE

## 2022-09-18 SURGERY — DILATATION & CURETTAGE/HYSTEROSCOPY WITH NOVASURE ABLATION
Anesthesia: General

## 2022-09-18 MED ORDER — LACTATED RINGERS IV SOLN: INTRAVENOUS | Status: DC

## 2022-09-18 MED ORDER — PROPOFOL 10 MG/ML IV BOLUS
INTRAVENOUS | Status: DC | PRN
  Administered 2022-09-18: 250 mg via INTRAVENOUS
  Administered 2022-09-18: 50 mg via INTRAVENOUS

## 2022-09-18 MED ORDER — BUPIVACAINE HCL (PF) 0.5 % IJ SOLN
INTRAMUSCULAR | Status: DC | PRN
  Administered 2022-09-18: 20 mL

## 2022-09-18 MED ORDER — FENTANYL CITRATE (PF) 100 MCG/2ML IJ SOLN: 25.0000 ug | INTRAMUSCULAR | Status: DC | PRN

## 2022-09-18 MED ORDER — DEXAMETHASONE SODIUM PHOSPHATE 10 MG/ML IJ SOLN
INTRAMUSCULAR | Status: AC
  Filled 2022-09-18: qty 1

## 2022-09-18 MED ORDER — AMISULPRIDE (ANTIEMETIC) 5 MG/2ML IV SOLN: 10.0000 mg | Freq: Once | INTRAVENOUS | Status: DC | PRN

## 2022-09-18 MED ORDER — MIDAZOLAM HCL 5 MG/5ML IJ SOLN
INTRAMUSCULAR | Status: DC | PRN
  Administered 2022-09-18: 2 mg via INTRAVENOUS

## 2022-09-18 MED ORDER — SCOPOLAMINE 1 MG/3DAYS TD PT72
MEDICATED_PATCH | TRANSDERMAL | Status: AC
  Filled 2022-09-18: qty 1

## 2022-09-18 MED ORDER — SCOPOLAMINE 1 MG/3DAYS TD PT72
1.0000 | MEDICATED_PATCH | TRANSDERMAL | Status: DC
  Administered 2022-09-18: 1.5 mg via TRANSDERMAL

## 2022-09-18 MED ORDER — ONDANSETRON HCL 4 MG/2ML IJ SOLN
INTRAMUSCULAR | Status: DC | PRN
  Administered 2022-09-18: 4 mg via INTRAVENOUS

## 2022-09-18 MED ORDER — MIDAZOLAM HCL 2 MG/2ML IJ SOLN
INTRAMUSCULAR | Status: AC
  Filled 2022-09-18: qty 2

## 2022-09-18 MED ORDER — LIDOCAINE HCL (PF) 2 % IJ SOLN
INTRAMUSCULAR | Status: AC
  Filled 2022-09-18: qty 5

## 2022-09-18 MED ORDER — ACETAMINOPHEN 325 MG PO TABS: 325.0000 mg | ORAL_TABLET | ORAL | Status: DC | PRN

## 2022-09-18 MED ORDER — LIDOCAINE 2% (20 MG/ML) 5 ML SYRINGE
INTRAMUSCULAR | Status: DC | PRN
  Administered 2022-09-18: 50 mg via INTRAVENOUS

## 2022-09-18 MED ORDER — OXYCODONE HCL 5 MG PO TABS: 5.0000 mg | ORAL_TABLET | Freq: Once | ORAL | Status: DC | PRN

## 2022-09-18 MED ORDER — DEXAMETHASONE SODIUM PHOSPHATE 10 MG/ML IJ SOLN
INTRAMUSCULAR | Status: DC | PRN
  Administered 2022-09-18: 4 mg via INTRAVENOUS

## 2022-09-18 MED ORDER — ONDANSETRON HCL 4 MG/2ML IJ SOLN
INTRAMUSCULAR | Status: AC
  Filled 2022-09-18: qty 2

## 2022-09-18 MED ORDER — KETOROLAC TROMETHAMINE 30 MG/ML IJ SOLN
INTRAMUSCULAR | Status: DC | PRN
  Administered 2022-09-18: 30 mg via INTRAVENOUS

## 2022-09-18 MED ORDER — FENTANYL CITRATE (PF) 100 MCG/2ML IJ SOLN
INTRAMUSCULAR | Status: AC
  Filled 2022-09-18: qty 2

## 2022-09-18 MED ORDER — SOD CITRATE-CITRIC ACID 500-334 MG/5ML PO SOLN: 30.0000 mL | ORAL | Status: DC

## 2022-09-18 MED ORDER — OXYCODONE HCL 5 MG/5ML PO SOLN: 5.0000 mg | Freq: Once | ORAL | Status: DC | PRN

## 2022-09-18 MED ORDER — ACETAMINOPHEN 500 MG PO TABS
ORAL_TABLET | ORAL | Status: AC
  Filled 2022-09-18: qty 2

## 2022-09-18 MED ORDER — PROMETHAZINE HCL 25 MG/ML IJ SOLN: 6.2500 mg | INTRAMUSCULAR | Status: DC | PRN

## 2022-09-18 MED ORDER — KETOROLAC TROMETHAMINE 30 MG/ML IJ SOLN
INTRAMUSCULAR | Status: AC
  Filled 2022-09-18: qty 1

## 2022-09-18 MED ORDER — ACETAMINOPHEN 500 MG PO TABS
1000.0000 mg | ORAL_TABLET | ORAL | Status: AC
  Administered 2022-09-18: 1000 mg via ORAL

## 2022-09-18 MED ORDER — ACETAMINOPHEN 10 MG/ML IV SOLN: 1000.0000 mg | Freq: Once | INTRAVENOUS | Status: DC | PRN

## 2022-09-18 MED ORDER — PROPOFOL 10 MG/ML IV BOLUS
INTRAVENOUS | Status: AC
  Filled 2022-09-18: qty 20

## 2022-09-18 MED ORDER — FENTANYL CITRATE (PF) 100 MCG/2ML IJ SOLN
INTRAMUSCULAR | Status: DC | PRN
  Administered 2022-09-18: 50 ug via INTRAVENOUS

## 2022-09-18 MED ORDER — ACETAMINOPHEN 160 MG/5ML PO SOLN: 325.0000 mg | ORAL | Status: DC | PRN

## 2022-09-18 SURGICAL SUPPLY — 11 items
ABLATOR SURESOUND NOVASURE (ABLATOR) ×1 IMPLANT
GLOVE BIO SURGEON STRL SZ7 (GLOVE) ×1 IMPLANT
GLOVE BIOGEL PI IND STRL 7.0 (GLOVE) ×1 IMPLANT
GOWN STRL REUS W/TWL LRG LVL3 (GOWN DISPOSABLE) ×1 IMPLANT
IV NS IRRIG 3000ML ARTHROMATIC (IV SOLUTION) ×1 IMPLANT
KIT PROCEDURE FLUENT (KITS) ×1 IMPLANT
KIT TURNOVER CYSTO (KITS) ×1 IMPLANT
PACK VAGINAL MINOR WOMEN LF (CUSTOM PROCEDURE TRAY) ×1 IMPLANT
PAD OB MATERNITY 4.3X12.25 (PERSONAL CARE ITEMS) ×1 IMPLANT
SEAL ROD LENS SCOPE MYOSURE (ABLATOR) ×1 IMPLANT
TOWEL OR 17X26 10 PK STRL BLUE (TOWEL DISPOSABLE) ×1 IMPLANT

## 2022-09-18 NOTE — Discharge Instructions (Signed)
NO TYLENOL/ACETAMINOPHEN PRODUCTS UNTIL AFTER 4:42PM TODAY  NO IBUPROFEN OR NSAIDS UNTIL AFTER 6PM TODAY        Post Anesthesia Home Care Instructions  Activity: Get plenty of rest for the remainder of the day. A responsible individual must stay with you for 24 hours following the procedure.  For the next 24 hours, DO NOT: -Drive a car -Paediatric nurse -Drink alcoholic beverages -Take any medication unless instructed by your physician -Make any legal decisions or sign important papers.  Meals: Start with liquid foods such as gelatin or soup. Progress to regular foods as tolerated. Avoid greasy, spicy, heavy foods. If nausea and/or vomiting occur, drink only clear liquids until the nausea and/or vomiting subsides. Call your physician if vomiting continues.  Special Instructions/Symptoms: Your throat may feel dry or sore from the anesthesia or the breathing tube placed in your throat during surgery. If this causes discomfort, gargle with warm salt water. The discomfort should disappear within 24 hours.  If you had a scopolamine patch placed behind your ear for the management of post- operative nausea and/or vomiting:  1. The medication in the patch is effective for 72 hours, after which it should be removed.  Wrap patch in a tissue and discard in the trash. Wash hands thoroughly with soap and water. 2. You may remove the patch earlier than 72 hours if you experience unpleasant side effects which may include dry mouth, dizziness or visual disturbances. 3. Avoid touching the patch. Wash your hands with soap and water after contact with the patch.

## 2022-09-18 NOTE — Anesthesia Procedure Notes (Signed)
Procedure Name: LMA Insertion Date/Time: 09/18/2022 11:41 AM  Performed by: Bonney Aid, CRNAPre-anesthesia Checklist: Patient identified, Emergency Drugs available, Suction available and Patient being monitored Patient Re-evaluated:Patient Re-evaluated prior to induction Oxygen Delivery Method: Circle system utilized Preoxygenation: Pre-oxygenation with 100% oxygen Induction Type: IV induction Ventilation: Mask ventilation without difficulty LMA: LMA inserted LMA Size: 4.0 Number of attempts: 1 Airway Equipment and Method: Bite block Placement Confirmation: positive ETCO2 Tube secured with: Tape Dental Injury: Teeth and Oropharynx as per pre-operative assessment

## 2022-09-18 NOTE — Brief Op Note (Signed)
09/18/2022  12:25 PM  PATIENT:  Felipa Emory Mcnall  46 y.o. female  PRE-OPERATIVE DIAGNOSIS:  AUB  POST-OPERATIVE DIAGNOSIS:  AUB  PROCEDURE:  Procedure(s): DILATATION & CURETTAGE/HYSTEROSCOPY WITH NOVASURE ABLATION (N/A)  SURGEON:  Surgeon(s) and Role:    * Lavonia Drafts, MD - Primary  ASSISTANTS: none   ANESTHESIA:   general and paracervical block  EBL:  20 mL   BLOOD ADMINISTERED:none  DRAINS: none   LOCAL MEDICATIONS USED:  MARCAINE     SPECIMEN:  Source of Specimen:  endometrial curettings  DISPOSITION OF SPECIMEN:  PATHOLOGY  COUNTS:  YES  TOURNIQUET:  * No tourniquets in log *  DICTATION: .Note written in EPIC  PLAN OF CARE: Discharge to home after PACU  PATIENT DISPOSITION:  PACU - hemodynamically stable.   Delay start of Pharmacological VTE agent (>24hrs) due to surgical blood loss or risk of bleeding: not applicable  Complications: none  Kenneith Stief L. Harraway-Smith, M.D., Cherlynn June

## 2022-09-18 NOTE — Anesthesia Postprocedure Evaluation (Signed)
Anesthesia Post Note  Patient: Yvette Perry  Procedure(s) Performed: DILATATION & CURETTAGE/HYSTEROSCOPY WITH [place] ABLATION     Patient location during evaluation: PACU Anesthesia Type: General Level of consciousness: awake and alert Pain management: pain level controlled Vital Signs Assessment: post-procedure vital signs reviewed and stable Respiratory status: spontaneous breathing, nonlabored ventilation, respiratory function stable and patient connected to nasal cannula oxygen Cardiovascular status: blood pressure returned to baseline and stable Postop Assessment: no apparent nausea or vomiting Anesthetic complications: no   No notable events documented.  Last Vitals:  Vitals:   09/18/22 1300 09/18/22 1315  BP: (!) 141/85 (!) 147/87  Pulse: 74 75  Resp: 15 16  Temp:  36.4 C  SpO2: 95% 95%    Last Pain:  Vitals:   09/18/22 1315  TempSrc:   PainSc: [place] Domanic Matusek

## 2022-09-18 NOTE — Transfer of Care (Signed)
Immediate Anesthesia Transfer of Care Note  Patient: Yvette Perry  Procedure(s) Performed: DILATATION & CURETTAGE/HYSTEROSCOPY WITH NOVASURE ABLATION  Patient Location: PACU  Anesthesia Type:General  Level of Consciousness: awake, alert , and oriented  Airway & Oxygen Therapy: Patient Spontanous Breathing and Patient connected to nasal cannula oxygen  Post-op Assessment: Report given to RN  Post vital signs: Reviewed and stable  Last Vitals:  Vitals Value Taken Time  BP 140/89 09/18/22 1222  Temp    Pulse 91 09/18/22 1223  Resp 22 09/18/22 1222  SpO2 100 % 09/18/22 1223  Vitals shown include unvalidated device data.  Last Pain:  Vitals:   09/18/22 1037  TempSrc: Oral      Patients Stated Pain Goal: 4 (70/23/01 7209)  Complications: No notable events documented.

## 2022-09-18 NOTE — Anesthesia Preprocedure Evaluation (Addendum)
Anesthesia Evaluation  Patient identified by MRN, date of birth, ID band Patient awake    Reviewed: Allergy & Precautions, NPO status , Patient's Chart, lab work & pertinent test results  Airway Mallampati: I  TM Distance: >3 FB Neck ROM: Full    Dental  (+) Teeth Intact, Dental Advisory Given   Pulmonary asthma    breath sounds clear to auscultation       Cardiovascular hypertension,  Rhythm:Regular Rate:Normal     Neuro/Psych  Headaches  negative psych ROS   GI/Hepatic negative GI ROS, Neg liver ROS,,,  Endo/Other  diabetes    Renal/GU negative Renal ROS     Musculoskeletal negative musculoskeletal ROS (+)    Abdominal   Peds  Hematology   Anesthesia Other Findings   Reproductive/Obstetrics                             Anesthesia Physical Anesthesia Plan  ASA: 3  Anesthesia Plan: General   Post-op Pain Management:    Induction: Intravenous  PONV Risk Score and Plan: 4 or greater and Ondansetron, Dexamethasone, Midazolam and Scopolamine patch - Pre-op  Airway Management Planned: LMA  Additional Equipment: None  Intra-op Plan:   Post-operative Plan: Extubation in OR  Informed Consent: I have reviewed the patients History and Physical, chart, labs and discussed the procedure including the risks, benefits and alternatives for the proposed anesthesia with the patient or authorized representative who has indicated his/her understanding and acceptance.     Dental advisory given  Plan Discussed with: CRNA  Anesthesia Plan Comments:        Anesthesia Quick Evaluation

## 2022-09-18 NOTE — H&P (Signed)
Preoperative History and Physical  Yvette Perry is a 46 y.o. [phone] here for surgical management of Abnormal uterine bleeding .   Proposed surgery: hysteroscopy with dilation and curettage and endometrial ablation using Novasure and hysteroscopic myomectomy if indicated.    Past Medical History:  Diagnosis Date   Abnormal Pap smear of cervix    LGSIL   Asthma    Diabetes mellitus ([place])    Goiter    Hypertension    Migraine    Pre-diabetes    Uterine fibroid    Past Surgical History:  Procedure Laterality Date   CESAREAN SECTION  06/2003   COLPOSCOPY  12/22/2008   normal   ENDOMETRIAL BIOPSY  06/30/2013   benign   INCISION AND DRAINAGE ABSCESS Right 02/02/2022   Procedure: INCISION AND DRAINAGE ABSCESS RIGHT CHEST WALL;  Surgeon: Autumn Messing III, MD;  Location: [facility];  Service: General;  Laterality: Right;  LATERAL/PT ATE AT 0930   TUBAL LIGATION  06/2003   OB History     Gravida  4   Para  4   Term  3   Preterm  1   AB      Living  4      SAB      IAB      Ectopic      Multiple  1   Live Births  4          Patient denies any cervical dysplasia or STIs. Medications Prior to Admission  Medication Sig Dispense Refill Last Dose   ascorbic acid (VITAMIN C) 500 MG tablet Take 500 mg by mouth 3 (three) times daily. With rose hips   09/17/2022   B Complex-C-Biotin-D-Zinc-FA (VITAL-D RX PO) Take 1,000 mg by mouth daily.   09/17/2022   butalbital-acetaminophen-caffeine (FIORICET WITH CODEINE) 50-325-40-30 MG capsule Take 1 capsule by mouth every 4 (four) hours as needed for headache.   Past Week   cholecalciferol (VITAMIN D3) 25 MCG (1000 UT) tablet Take 1,000 Units by mouth daily.   09/17/2022   ferrous sulfate 325 (65 FE) MG tablet Take 325 mg by mouth 3 (three) times daily with meals.   09/17/2022   fexofenadine (ALLEGRA) 180 MG tablet Take 180 mg by mouth daily.   09/17/2022   fluticasone (FLONASE) 50 MCG/ACT nasal spray Place 1 spray into the nose daily.     09/17/2022   fluticasone-salmeterol (ADVAIR) 500-50 MCG/ACT AEPB Inhale 1 puff into the lungs in the morning and at bedtime.   09/18/2022   gabapentin (NEURONTIN) 300 MG capsule Take 300 mg by mouth as needed (moderate pain).   09/17/2022   Ipratropium-Albuterol (COMBIVENT RESPIMAT) 20-100 MCG/ACT AERS respimat Inhale 2 puffs into the lungs every 6 (six) hours as needed.   09/18/2022   lisinopril-hydrochlorothiazide (ZESTORETIC) 20-12.5 MG tablet Take 2 tablets by mouth daily. 60 tablet 0 09/17/2022   megestrol (MEGACE) 40 MG tablet Take 1 tablet (40 mg total) by mouth 2 (two) times daily. 60 tablet 0 09/17/2022   metFORMIN (GLUCOPHAGE-XR) 500 MG 24 hr tablet Take 1,000 mg by mouth in the morning and at bedtime.   09/17/2022   montelukast (SINGULAIR) 10 MG tablet Take 10 mg by mouth at bedtime.   09/17/2022   ondansetron (ZOFRAN-ODT) 4 MG disintegrating tablet Take 1-2 tablets (4-8 mg total) by mouth every 8 (eight) hours as needed. 30 tablet 3 Past Month   albuterol (PROVENTIL HFA;VENTOLIN HFA) 108 (90 BASE) MCG/ACT inhaler Inhale 2 puffs into the lungs every 6 (  six) hours as needed. For shortness of breath  (Patient not taking: Reported on 01/31/2022)      diphenhydrAMINE (BENADRYL) 25 MG tablet Take by mouth.   Unknown   EPINEPHrine (EPIPEN 2-PAK) 0.3 mg/0.3 mL IJ SOAJ injection Inject 0.3 mLs (0.3 mg total) into the muscle once as needed (allergic reaction). (Patient taking differently: Inject 0.3 mg into the muscle once as needed for anaphylaxis (allergic reaction).) 2 Device 0    fluconazole (DIFLUCAN) 150 MG tablet Take 1 tablet (150 mg total) by mouth every 3 (three) days. For three doses 3 tablet 0    HYDROcodone-acetaminophen (NORCO/VICODIN) 5-325 MG tablet Take 1 tablet by mouth every 6 (six) hours as needed for moderate pain. (Patient not taking: Reported on 02/28/2022) 15 tablet 0    ibuprofen (ADVIL,MOTRIN) 800 MG tablet Take 1 tablet (800 mg total) by mouth every 8 (eight) hours as needed for  cramping. 30 tablet 2    ipratropium (ATROVENT) 0.03 % nasal spray Place 2 sprays into both nostrils 3 (three) times daily.      megestrol (MEGACE) 40 MG tablet Take 40 mg by mouth. Takes if needed r/t cycle      metroNIDAZOLE (FLAGYL) 500 MG tablet Take 1 tablet (500 mg total) by mouth 2 (two) times daily. 14 tablet 0    prochlorperazine (COMPAZINE) 10 MG tablet Take 10 mg by mouth every 6 (six) hours as needed for nausea or vomiting.      rizatriptan (MAXALT-MLT) 10 MG disintegrating tablet Take 1 tablet (10 mg total) by mouth as needed for migraine. May repeat in 2 hours if needed (Patient not taking: Reported on 02/28/2022) 9 tablet 11    topiramate (TOPAMAX) 50 MG tablet Start with one pill at bedtime('50mg'$ ). In 2 weeks, if no side effects, increase to 2 pills ('100mg'$ ) at bedtime (Patient not taking: Reported on 01/31/2022) 60 tablet 6     Allergies  Allergen Reactions   Beef-Derived Products Hives   [employer] Bread [Wolf[place] Coc[place]] Hives   Social History:   reports that she has never smoked. She has never used smokeless tobacco. She reports current alcohol use. She reports that she does not use drugs. Family History  Problem Relation Age of Onset   Hypertension Father    Diabetes Father    Kidney failure Father    Hypertension Mother     Review of Systems: Noncontributory  PHYSICAL EXAM: Blood pressure (!) 171/116, pulse 91, temperature 98.1 F (36.7 C), temperature source Oral, resp. rate 18, height '5\' 6"'$  (1.676 m), weight (!) 139.9 kg, last menstrual period 09/11/2022, SpO2 100 %. General appearance - alert, well appearing, and in no distress Chest - clear to auscultation, no wheezes, rales or rhonchi, symmetric air entry Heart - normal rate and regular rhythm Abdomen - soft, nontender, nondistended, no masses or organomegaly Pelvic - not repeated. Small and mobile in ofc.  Extremities - peripheral pulses normal, no pedal edema, no clubbing or cyanosis  Labs: Results for orders  placed or performed during the hospital encounter of 09/18/22 (from the past 336 hour(s))  Pregnancy, urine POC   Collection Time: 09/18/22 10:10 AM  Result Value Ref Range   Preg Test, Ur NEGATIVE NEGATIVE   Surg path 05/23/2020: FINAL MICROSCOPIC DIAGNOSIS:   A. ENDOMETRIUM, BIOPSY:  -  Predominantly blood with scant fragments of degenerating secretory  pattern endometrium  Imaging Studies: 05/03/2022 CLINICAL DATA:  Abnormal uterine bleeding   EXAM: TRANSABDOMINAL AND TRANSVAGINAL ULTRASOUND OF PELVIS   TECHNIQUE: Both  transabdominal and transvaginal ultrasound examinations of the pelvis were performed. Transabdominal technique was performed for global imaging of the pelvis including uterus, ovaries, adnexal regions, and pelvic cul-de-sac. It was necessary to proceed with endovaginal exam following the transabdominal exam to visualize the endometrium and ovaries.   COMPARISON:  None Available.   FINDINGS: Uterus   Measurements: 8.9 x 5.5 x 6.1 cm = volume: 154.9 mL. There is inhomogeneous echogenicity in myometrium. There is 1.5 x 1.2 cm hypoechoic structure in the fundus, possibly fibroid.   Endometrium   Thickness: 3.9 mm.  IUD is not seen in the endometrial cavity.   Right ovary   Measurements: 4.6 x 3.2 x 2.8 cm = volume: 21.2 mL. There is a 6 x 7 mm hyperechoic focus, possibly a small dermoid.   Left ovary   Not sonographically visualized.   Other findings   No abnormal free fluid.   IMPRESSION: There is inhomogeneous echogenicity in myometrium with 1.5 cm fibroid in the fundus. IUD is not visualized in the uterine cavity.   Left ovary is not sonographically visualized. Possible 7 mm dermoid is seen in right ovary. Pelvic sonogram is otherwise unremarkable.    Assessment: Patient Active Problem List   Diagnosis Date Noted   Abnormal uterine bleeding (AUB) 09/18/2022   Abscess of chest wall 02/01/2022   Cellulitis 01/31/2022   Migraine     Diabetes mellitus (HCC)    Asthma    Hypertension    Obesity, Class III, BMI 40-49.9 (morbid obesity) ([place])    Hypokalemia    Normocytic anemia    Migraine without aura and without status migrainosus, not intractable 04/04/2021    Plan: Patient will undergo surgical management with hysteroscopy with dilation and curettage and endometrial ablation using Novasure and hysteroscopic myomectomy if indicated.  The risks of surgery were discussed in detail with the patient including but not limited to: bleeding which may require transfusion or reoperation; infection which may require antibiotics; injury to surrounding organs which may involve bowel, bladder, ureters ; need for additional procedures including laparoscopy or laparotomy; thromboembolic phenomenon, surgical site problems and other postoperative/anesthesia complications. Likelihood of success in alleviating the patient's condition was discussed. Routine postoperative instructions will be reviewed with the patient and her family in detail after surgery.  The patient concurred with the proposed plan, giving informed written consent for the surgery.  Patient has been NPO since last night she will remain NPO for procedure.  Anesthesia and OR aware.  Preoperative prophylactic antibiotics and SCDs ordered on call to the OR.  To OR when ready.  Bessy Reaney L. Ihor Dow, M.D., [facility] 09/18/2022 10:46 AM

## 2022-09-18 NOTE — Op Note (Signed)
09/18/2022  12:25 PM  PATIENT:  Yvette Perry  46 y.o. female  PRE-OPERATIVE DIAGNOSIS:  AUB  POST-OPERATIVE DIAGNOSIS:  AUB  PROCEDURE:  Procedure(s): DILATATION & CURETTAGE/HYSTEROSCOPY WITH NOVASURE ABLATION (N/A)  SURGEON:  Surgeon(s) and Role:    * Lavonia Drafts, MD - Primary  ASSISTANTS: none   ANESTHESIA:   general and paracervical block  EBL:  20 mL   BLOOD ADMINISTERED:none  DRAINS: none   LOCAL MEDICATIONS USED:  MARCAINE     SPECIMEN:  Source of Specimen:  endometrial curettings  DISPOSITION OF SPECIMEN:  PATHOLOGY  COUNTS:  YES  TOURNIQUET:  * No tourniquets in log *  DICTATION: .Note written in EPIC  PLAN OF CARE: Discharge to home after PACU  PATIENT DISPOSITION:  PACU - hemodynamically stable.   Delay start of Pharmacological VTE agent (>24hrs) due to surgical blood loss or risk of bleeding: not applicable  Complications: none  Pt with AUB and thickened endometrium.   Findings: both ostia noted. No fibroids noted.    The risks, benefits, and alternatives of surgery were explained, understood, and accepted. The consents were signed and all questions were answered. She was taken to the operating room and general anesthesia was applied without complication. She was placed in the dorsal lithotomy position and her vagina and abdomen were prepped and draped in the usual sterile fashion. A bimanual exam revealed a normal size and shape anteverted mobile uterus. Her adnexa were non-enlarged.    A bivalved speculum was placed in the patients' vagina and the anterior lip of the cervix was grasped with a single toothed tenaculum. A paracervical block was performed at 5 and 7 o'clock with 20cc of 0.5% Marcaine.   The endometrial cavity was sounded to 10.5cm and the endocervical length measured 3cm. A hysteroscope was inserted and the endometrium was noted to be thickened with several polyps.  The ostia on both sides were noted.  The scope was removed  and a sharp currete was used to scape the lining of the uterus until a gritty texture was noted throughout.  Specimens were sent to pathology.  The NovaSure device was then inserted and seated using 6.5cm as the cavity length and 4.9 cm as the cavity width.  The total activation time was 1 min 36 sec. The hysteroscope was reinserted and an even burn pattern was noted to the fundus.  The single toothed tenaculum was removed at the end of the case and no bleeding was noted from the cervix.   The patient was extubated and taken to the recovery room in stable condition.  Sponge, lap and instrument counts were correct.  There were no complications.   Jemma Rasp L. Harraway-Smith, M.D., [facility]

## 2022-09-19 ENCOUNTER — Encounter (HOSPITAL_BASED_OUTPATIENT_CLINIC_OR_DEPARTMENT_OTHER): Payer: Self-pay | Admitting: Obstetrics & Gynecology

## 2022-09-19 LAB — SURGICAL PATHOLOGY

## 2022-10-31 ENCOUNTER — Encounter: Payer: Managed Care, Other (non HMO) | Admitting: Obstetrics & Gynecology

## 2022-10-31 IMAGING — CT CT CHEST W/ CM
2 of 3 series · 15 of 36 positions shown, 18 images · IV contrast (Omnipaque)
Comparison: None.

CLINICAL DATA: Soft tissue mass, chest, deep possible abscess on
the right upper flank

EXAM:
CT CHEST WITH CONTRAST
TECHNIQUE: Multidetector CT imaging of the chest was performed during
intravenous contrast administration.

[Series 3: axial st · axial · 0.98mm/px · z∈[-304,-46]mm · 12 of 153 slices shown, 15 images]
[im 12/153  mediastinal]
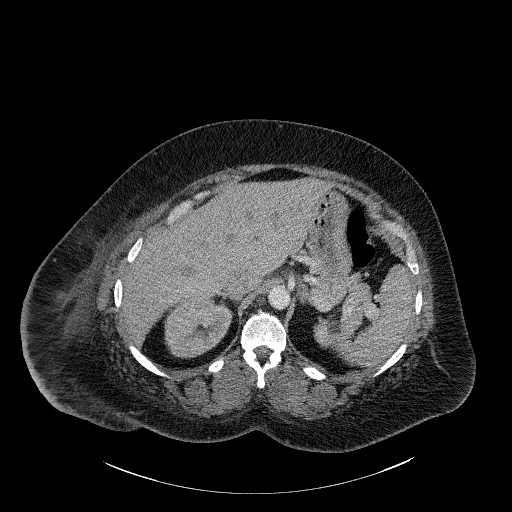
[im 12/153  lung]
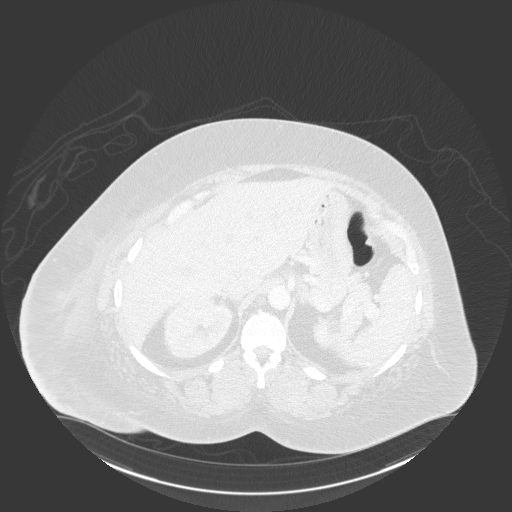
[im 23/153  lung]
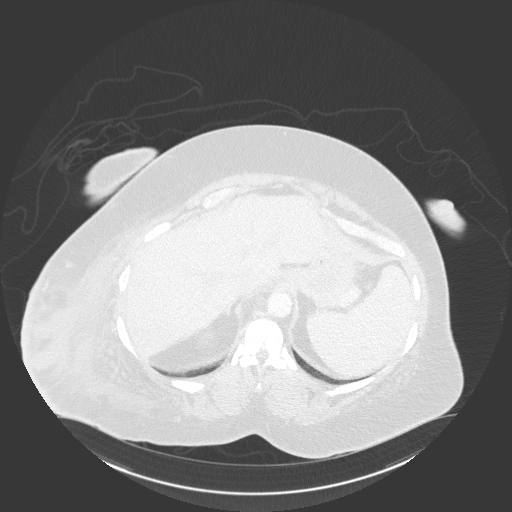
[im 34/153  lung]
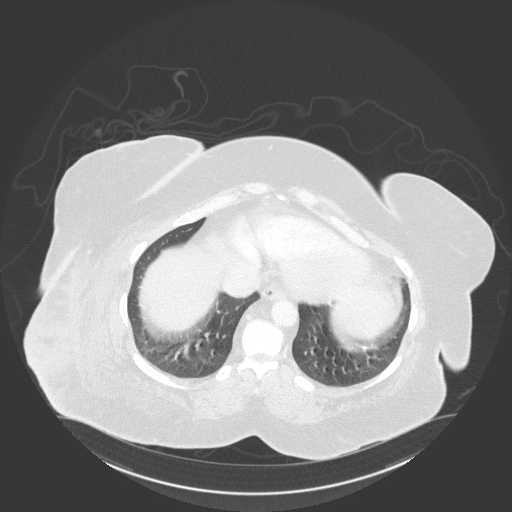
[im 46/153  lung]
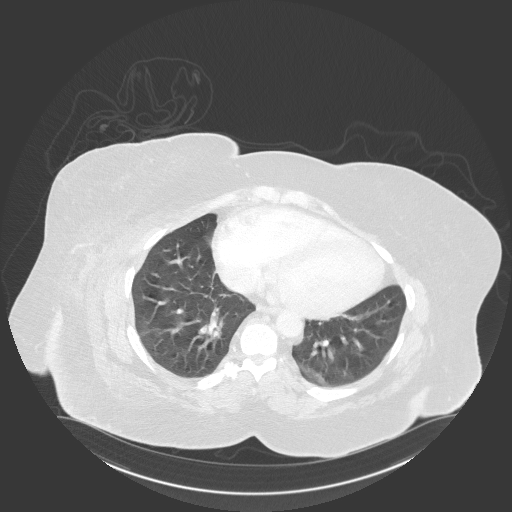
[im 57/153  mediastinal]
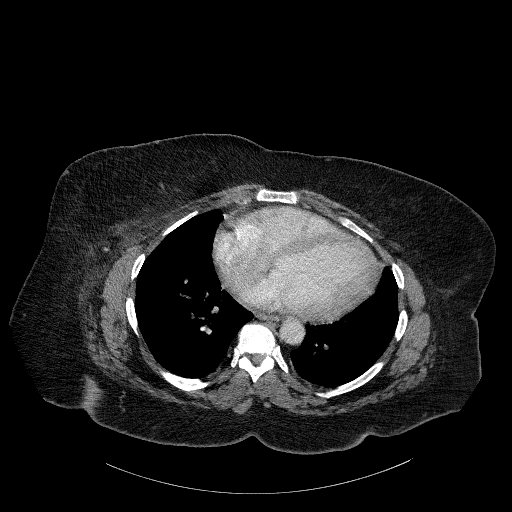
[im 57/153  lung]
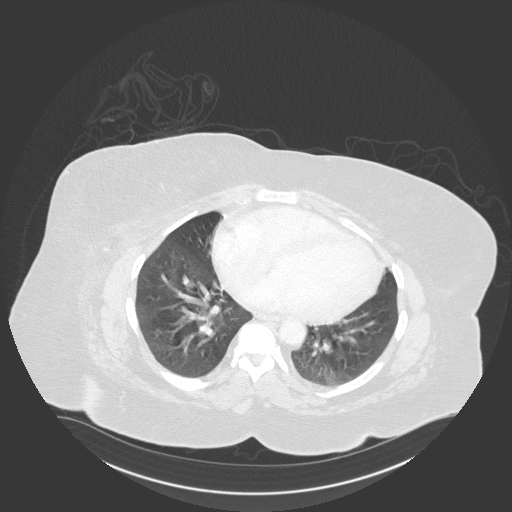
[im 68/153  lung]
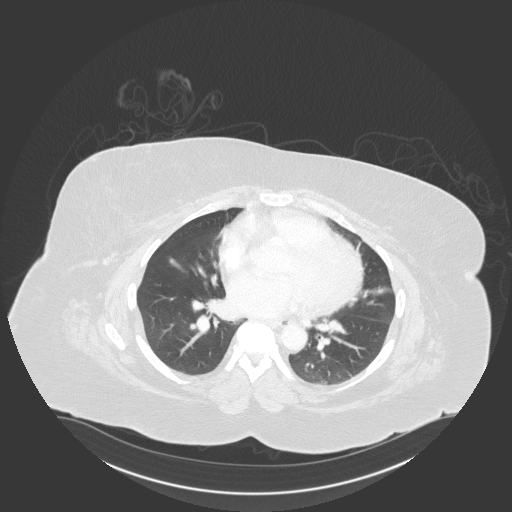
[im 85/153  lung]
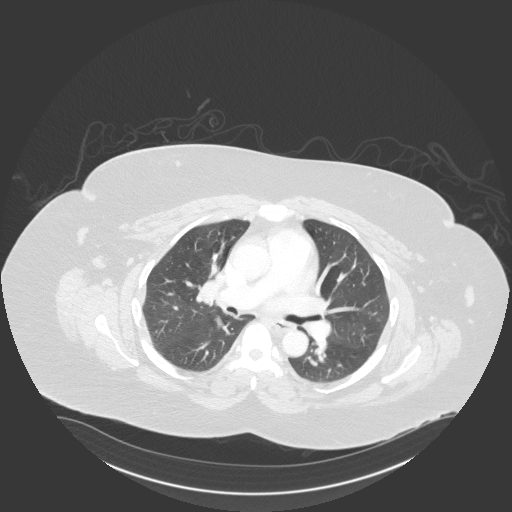
[im 96/153  lung]
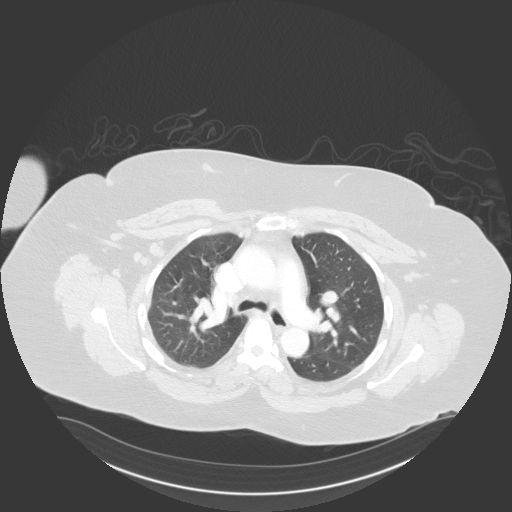
[im 107/153  mediastinal]
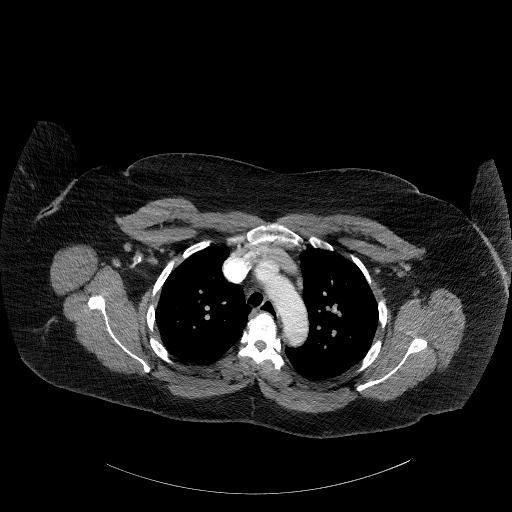
[im 107/153  lung]
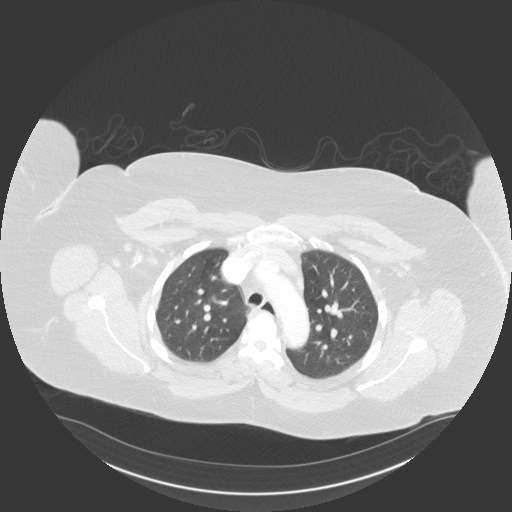
[im 119/153  lung]
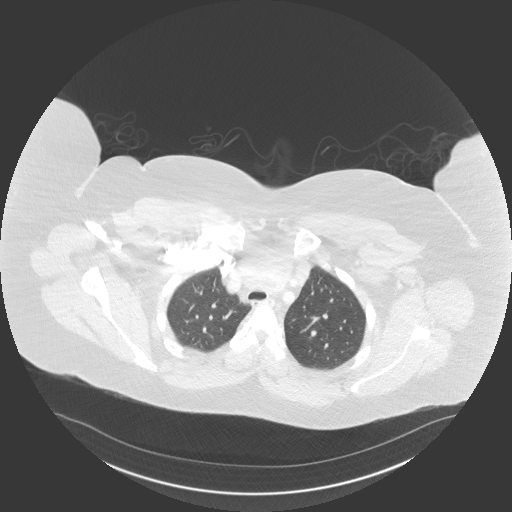
[im 130/153  lung]
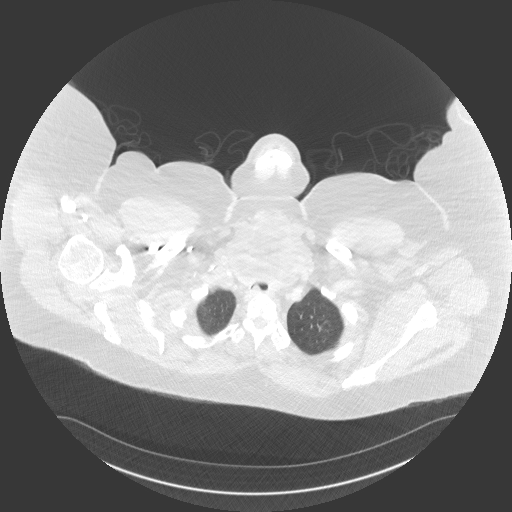
[im 141/153  lung]
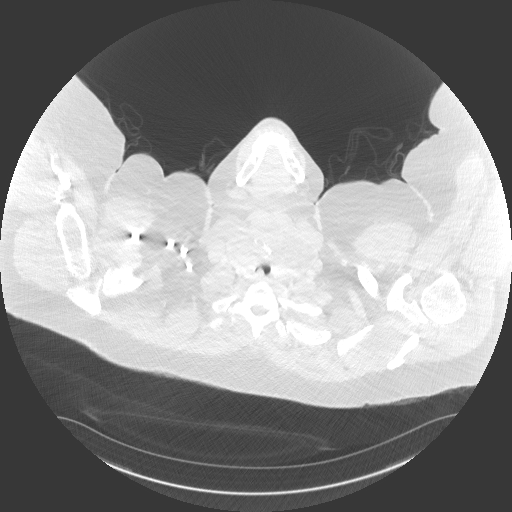

[Series 6: coronal · coronal · 0.62mm/px · 3 of 143 slices shown]
[im 29/143  lung]
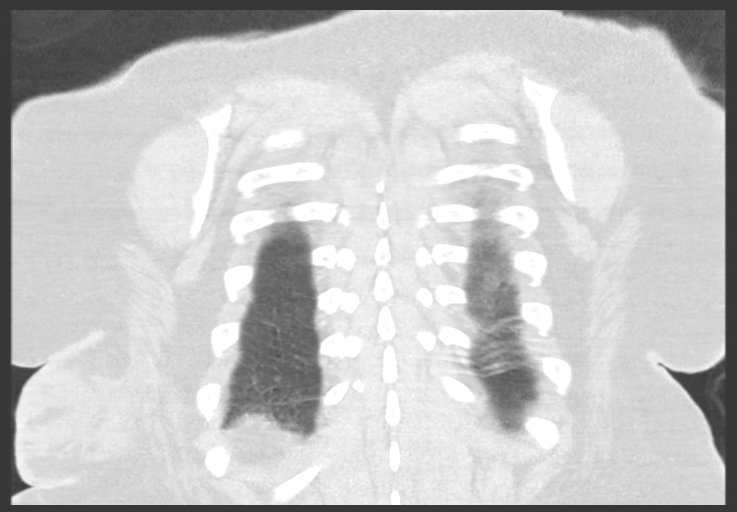
[im 57/143  lung]
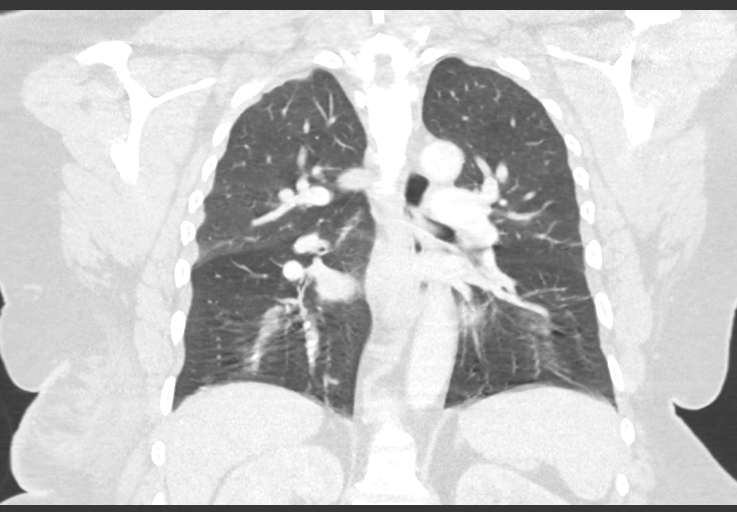
[im 86/143  lung]
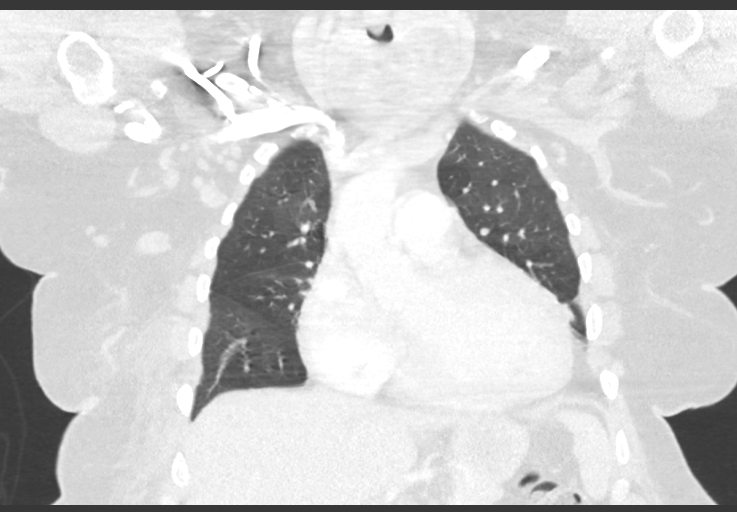

[15 of 36 positions shown; findings below may reference images not displayed]

RADIATION DOSE REDUCTION: This exam was performed according to the
departmental dose-optimization program which includes automated
exposure control, adjustment of the mA and/or kV according to
patient size and/or use of iterative reconstruction technique.

CONTRAST:  100mL OMNIPAQUE IOHEXOL 300 MG/ML  SOLN
FINDINGS: Cardiovascular: Heart is normal size. Aorta is normal caliber.

Mediastinum/Nodes: No mediastinal, hilar, or axillary adenopathy.
Trachea and esophagus are unremarkable. Marked heterogeneous and
masslike enlargement of the thyroid. Recommend thyroid US (ref: [HOSPITAL]. [DATE]): 143-50).

Lungs/Pleura: Lungs are clear. No focal airspace opacities or
suspicious nodules. No effusions.

Upper Abdomen: Low-density lesion in the spleen measuring 2 cm,
likely cyst or hemangioma. No acute findings.

Musculoskeletal: Within the right lower chest wall and upper
abdominal wall laterally there is extensive stranding in the
subcutaneous soft tissues. No focal fluid collection. This could
reflect hematoma or infection/phlegmon. No drainable abscess. No
acute bony abnormality.
IMPRESSION: Stranding within the subcutaneous soft tissues of the right lateral
lower chest and upper abdominal wall which could reflect hematoma or
phlegmon/infection. No drainable abscess.

No acute cardiopulmonary disease.

## 2022-11-14 ENCOUNTER — Encounter: Payer: Self-pay | Admitting: Obstetrics & Gynecology

## 2022-11-14 ENCOUNTER — Ambulatory Visit: Payer: Managed Care, Other (non HMO) | Admitting: Obstetrics & Gynecology

## 2022-11-14 VITALS — BP 126/69 | HR 81 | Wt 312.0 lb

## 2022-11-14 DIAGNOSIS — N939 Abnormal uterine and vaginal bleeding, unspecified: Secondary | ICD-10-CM | POA: Diagnosis not present

## 2022-11-14 NOTE — Progress Notes (Signed)
History:  47 y.o.LMP here today for 2 week post op check.Pt is s/p IUD removal with hysteroscopy with endometrial ablation on 09/18/2022.  Pt reports that she is doing well. She denies pelvic pain. She does have some pain in her uppper sides. She is eating and passing stools without difficulty. She denies any bleeding at all since the procedure. She did have 3 weeks of some discharge post procedure that has passed.      The following portions of the patient's history were reviewed and updated as appropriate: allergies, current medications, past family history, past medical history, past social history, past surgical history and problem list.  Review of Systems:  Pertinent items are noted in HPI.    Objective:  Physical Exam BP 126/69   Pulse 81   Wt (!) 312 lb (141.5 kg)   BMI 50.36 kg/m   CONSTITUTIONAL: Well-developed, well-nourished female in no acute distress.  HENT:  Normocephalic, atraumatic EYES: Conjunctivae and EOM are normal. No scleral icterus.  NECK: Normal range of motion SKIN: Skin is warm and dry. No rash noted. Not diaphoretic.No pallor. [place]: Alert and oriented to person, place, and time. Normal coordination.  Pelvic: deferred  Labs and Imaging Surg path 09/18/2022 FINAL MICROSCOPIC DIAGNOSIS:   A. ENDOMETRIUM, CURETTAGE:  - Benign inactive endometrium with hormone effect  - Negative for hyperplasia or malignancy    Assessment & Plan:  Post op check following IUD removal with hysteroscopy with endometrial ablation on 09/18/2022.  Doing well  Reviewed her surg path.   Reviewed post op instructions and activities  F/u in1 year or sooner prn  All questions answered.   Muad Noga L. Harraway-Smith, M.D., [facility]

## 2022-12-27 DIAGNOSIS — M47816 Spondylosis without myelopathy or radiculopathy, lumbar region: Secondary | ICD-10-CM | POA: Diagnosis not present

## 2022-12-27 DIAGNOSIS — M533 Sacrococcygeal disorders, not elsewhere classified: Secondary | ICD-10-CM | POA: Diagnosis not present

## 2022-12-27 DIAGNOSIS — M5416 Radiculopathy, lumbar region: Secondary | ICD-10-CM | POA: Diagnosis not present

## 2022-12-27 DIAGNOSIS — M7918 Myalgia, other site: Secondary | ICD-10-CM | POA: Diagnosis not present

## 2023-01-09 ENCOUNTER — Other Ambulatory Visit: Payer: Self-pay

## 2023-01-09 MED ORDER — PANTOPRAZOLE SODIUM 40 MG PO TBEC
40.0000 mg | DELAYED_RELEASE_TABLET | Freq: Every morning | ORAL | 1 refills | Status: DC
  Filled 2023-01-09: qty 90, 90d supply, fill #0
  Filled 2023-05-05: qty 90, 90d supply, fill #1

## 2023-01-09 MED ORDER — FLUTICASONE-SALMETEROL 500-50 MCG/ACT IN AEPB
1.0000 | INHALATION_SPRAY | Freq: Two times a day (BID) | RESPIRATORY_TRACT | 11 refills | Status: DC
  Filled 2023-01-09: qty 60, 30d supply, fill #0
  Filled 2023-02-06: qty 60, 30d supply, fill #1
  Filled 2023-03-26: qty 60, 30d supply, fill #2
  Filled 2023-05-05: qty 60, 30d supply, fill #3
  Filled 2023-06-14: qty 60, 30d supply, fill #4
  Filled 2023-07-14: qty 60, 30d supply, fill #5
  Filled 2023-08-30 – 2023-09-11 (×2): qty 60, 30d supply, fill #6
  Filled 2023-10-15: qty 60, 30d supply, fill #7
  Filled 2023-12-17: qty 60, 30d supply, fill #9

## 2023-01-09 MED ORDER — MONTELUKAST SODIUM 10 MG PO TABS
10.0000 mg | ORAL_TABLET | Freq: Every day | ORAL | 1 refills | Status: DC
  Filled 2023-01-09: qty 90, 90d supply, fill #0
  Filled 2023-05-05: qty 90, 90d supply, fill #1

## 2023-01-09 MED ORDER — FLUTICASONE PROPIONATE 50 MCG/ACT NA SUSP
2.0000 | Freq: Two times a day (BID) | NASAL | 5 refills | Status: AC
  Filled 2023-01-09: qty 16, 30d supply, fill #0
  Filled 2023-03-26: qty 16, 30d supply, fill #1
  Filled 2023-12-17: qty 16, 30d supply, fill #2

## 2023-01-11 ENCOUNTER — Other Ambulatory Visit: Payer: Self-pay

## 2023-02-05 ENCOUNTER — Other Ambulatory Visit: Payer: Self-pay

## 2023-02-05 ENCOUNTER — Telehealth: Payer: Self-pay | Admitting: Physician Assistant

## 2023-02-05 DIAGNOSIS — J45901 Unspecified asthma with (acute) exacerbation: Secondary | ICD-10-CM

## 2023-02-05 DIAGNOSIS — J019 Acute sinusitis, unspecified: Secondary | ICD-10-CM

## 2023-02-05 DIAGNOSIS — B9689 Other specified bacterial agents as the cause of diseases classified elsewhere: Secondary | ICD-10-CM

## 2023-02-05 MED ORDER — FLUCONAZOLE 150 MG PO TABS
ORAL_TABLET | ORAL | 0 refills | Status: DC
  Filled 2023-02-05: qty 2, 3d supply, fill #0

## 2023-02-05 MED ORDER — AMOXICILLIN-POT CLAVULANATE 875-125 MG PO TABS
1.0000 | ORAL_TABLET | Freq: Two times a day (BID) | ORAL | 0 refills | Status: DC
  Filled 2023-02-05: qty 14, 7d supply, fill #0

## 2023-02-05 MED ORDER — PREDNISONE 20 MG PO TABS
40.0000 mg | ORAL_TABLET | Freq: Every day | ORAL | 0 refills | Status: DC
  Filled 2023-02-05: qty 10, 5d supply, fill #0

## 2023-02-05 NOTE — Progress Notes (Signed)
E-Visit for Sinus Problems  We are sorry that you are not feeling well.  Here is how we plan to help!  Based on what you have shared with me it looks like you have sinusitis.  Sinusitis is inflammation and infection in the sinus cavities of the head.  Based on your presentation I believe you most likely have Acute Bacterial Sinusitis.  This is an infection caused by bacteria and is treated with antibiotics. I have prescribed Augmentin /125mg  one tablet twice daily with food, for 7 days. You may use an oral decongestant such as Mucinex D or if you have glaucoma or high blood pressure use plain Mucinex. Saline nasal spray help and can safely be used as often as needed for congestion.Giving exacerbation of asthma symptoms,I will add-on a sort course of prednisone to take as directed.  Diflucan has been placed on file at the pharmacy, just in case.     If you develop worsening sinus pain, fever or notice severe headache and vision changes, or if symptoms are not better after completion of antibiotic, please schedule an appointment with a health care provider.    Sinus infections are not as easily transmitted as other respiratory infection, however we still recommend that you avoid close contact with loved ones, especially the very young and elderly.  Remember to wash your hands thoroughly throughout the day as this is the number one way to prevent the spread of infection!  Home Care: Only take medications as instructed by your medical team. Complete the entire course of an antibiotic. Do not take these medications with alcohol. A steam or ultrasonic humidifier can help congestion.  You can place a towel over your head and breathe in the steam from hot water coming from a faucet. Avoid close contacts especially the very young and the elderly. Cover your mouth when you cough or sneeze. Always remember to wash your hands.  Get Help Right Away If: You develop worsening fever or sinus pain. You  develop a severe head ache or visual changes. Your symptoms persist after you have completed your treatment plan.  Make sure you Understand these instructions. Will watch your condition. Will get help right away if you are not doing well or get worse.  Thank you for choosing an e-visit.  Your e-visit answers were reviewed by a board certified advanced clinical practitioner to complete your personal care plan. Depending upon the condition, your plan could have included both over the counter or prescription medications.  Please review your pharmacy choice. Make sure the pharmacy is open so you can pick up prescription now. If there is a problem, you may contact your provider through [employer] and have the prescription routed to another pharmacy.  Your safety is important to [place]. If you have drug allergies check your prescription carefully.   For the next 24 hours you can use MyChart to ask questions about today's visit, request a non-urgent call back, or ask for a work or school excuse. You will get an email in the next two days asking about your experience. I hope that your e-visit has been valuable and will speed your recovery.

## 2023-02-05 NOTE — Progress Notes (Signed)
I have spent 5 minutes in review of e-visit questionnaire, review and updating patient chart, medical decision making and response to patient.   Dearion Huot Cody Stewart Sasaki, PA-C    

## 2023-02-12 ENCOUNTER — Other Ambulatory Visit: Payer: Self-pay

## 2023-02-12 MED ORDER — LISINOPRIL-HYDROCHLOROTHIAZIDE 20-12.5 MG PO TABS
2.0000 | ORAL_TABLET | Freq: Every day | ORAL | 1 refills | Status: AC
  Filled 2023-02-12 – 2023-12-17 (×2): qty 180, 90d supply, fill #0

## 2023-02-12 MED ORDER — LISINOPRIL-HYDROCHLOROTHIAZIDE 20-12.5 MG PO TABS
2.0000 | ORAL_TABLET | Freq: Every day | ORAL | 1 refills | Status: DC
  Filled 2023-02-12: qty 180, 90d supply, fill #0
  Filled 2023-05-05: qty 180, 90d supply, fill #1

## 2023-02-13 ENCOUNTER — Other Ambulatory Visit: Payer: Self-pay

## 2023-02-13 MED ORDER — GABAPENTIN 300 MG PO CAPS
600.0000 mg | ORAL_CAPSULE | Freq: Every day | ORAL | 2 refills | Status: AC
  Filled 2023-02-13: qty 60, 30d supply, fill #0
  Filled 2023-03-26: qty 60, 30d supply, fill #1
  Filled 2023-05-05: qty 60, 30d supply, fill #2

## 2023-02-13 MED ORDER — GABAPENTIN 400 MG PO CAPS
400.0000 mg | ORAL_CAPSULE | Freq: Two times a day (BID) | ORAL | 2 refills | Status: DC
  Filled 2023-02-13: qty 60, 30d supply, fill #0
  Filled 2023-03-26: qty 60, 30d supply, fill #1
  Filled 2023-05-05: qty 60, 30d supply, fill #2

## 2023-02-20 ENCOUNTER — Other Ambulatory Visit: Payer: Self-pay

## 2023-02-20 MED ORDER — COMBIVENT RESPIMAT 20-100 MCG/ACT IN AERS
2.0000 | INHALATION_SPRAY | Freq: Four times a day (QID) | RESPIRATORY_TRACT | 11 refills | Status: AC | PRN
  Filled 2023-02-20: qty 4, 15d supply, fill #0
  Filled 2023-03-26: qty 4, 25d supply, fill #0
  Filled 2023-05-05: qty 4, 25d supply, fill #1
  Filled 2023-07-14: qty 4, 25d supply, fill #2
  Filled 2023-08-28: qty 4, 25d supply, fill #3
  Filled 2023-09-19: qty 4, 25d supply, fill #4
  Filled 2023-12-27: qty 4, 25d supply, fill #5

## 2023-03-05 ENCOUNTER — Other Ambulatory Visit: Payer: Self-pay

## 2023-03-14 ENCOUNTER — Other Ambulatory Visit (HOSPITAL_COMMUNITY): Payer: Self-pay

## 2023-03-16 DIAGNOSIS — M5441 Lumbago with sciatica, right side: Secondary | ICD-10-CM | POA: Diagnosis not present

## 2023-03-16 DIAGNOSIS — M25572 Pain in left ankle and joints of left foot: Secondary | ICD-10-CM | POA: Diagnosis not present

## 2023-03-16 DIAGNOSIS — M5136 Other intervertebral disc degeneration, lumbar region: Secondary | ICD-10-CM | POA: Diagnosis not present

## 2023-03-16 DIAGNOSIS — G8929 Other chronic pain: Secondary | ICD-10-CM | POA: Diagnosis not present

## 2023-03-16 DIAGNOSIS — M25551 Pain in right hip: Secondary | ICD-10-CM | POA: Diagnosis not present

## 2023-03-21 DIAGNOSIS — M47816 Spondylosis without myelopathy or radiculopathy, lumbar region: Secondary | ICD-10-CM | POA: Diagnosis not present

## 2023-03-21 DIAGNOSIS — M7918 Myalgia, other site: Secondary | ICD-10-CM | POA: Diagnosis not present

## 2023-03-21 DIAGNOSIS — M5416 Radiculopathy, lumbar region: Secondary | ICD-10-CM | POA: Diagnosis not present

## 2023-03-21 DIAGNOSIS — M533 Sacrococcygeal disorders, not elsewhere classified: Secondary | ICD-10-CM | POA: Diagnosis not present

## 2023-03-26 ENCOUNTER — Other Ambulatory Visit: Payer: Self-pay

## 2023-04-03 ENCOUNTER — Other Ambulatory Visit: Payer: Self-pay

## 2023-04-12 DIAGNOSIS — E119 Type 2 diabetes mellitus without complications: Secondary | ICD-10-CM | POA: Diagnosis not present

## 2023-04-12 DIAGNOSIS — I10 Essential (primary) hypertension: Secondary | ICD-10-CM | POA: Diagnosis not present

## 2023-04-12 DIAGNOSIS — M5417 Radiculopathy, lumbosacral region: Secondary | ICD-10-CM | POA: Diagnosis not present

## 2023-04-12 DIAGNOSIS — J45909 Unspecified asthma, uncomplicated: Secondary | ICD-10-CM | POA: Diagnosis not present

## 2023-04-26 DIAGNOSIS — Z0000 Encounter for general adult medical examination without abnormal findings: Secondary | ICD-10-CM | POA: Diagnosis not present

## 2023-04-26 DIAGNOSIS — I10 Essential (primary) hypertension: Secondary | ICD-10-CM | POA: Diagnosis not present

## 2023-04-26 DIAGNOSIS — D508 Other iron deficiency anemias: Secondary | ICD-10-CM | POA: Diagnosis not present

## 2023-04-26 DIAGNOSIS — E559 Vitamin D deficiency, unspecified: Secondary | ICD-10-CM | POA: Diagnosis not present

## 2023-04-26 DIAGNOSIS — E1165 Type 2 diabetes mellitus with hyperglycemia: Secondary | ICD-10-CM | POA: Diagnosis not present

## 2023-04-26 DIAGNOSIS — E049 Nontoxic goiter, unspecified: Secondary | ICD-10-CM | POA: Diagnosis not present

## 2023-05-03 DIAGNOSIS — M47816 Spondylosis without myelopathy or radiculopathy, lumbar region: Secondary | ICD-10-CM | POA: Diagnosis not present

## 2023-05-03 DIAGNOSIS — M7918 Myalgia, other site: Secondary | ICD-10-CM | POA: Diagnosis not present

## 2023-05-03 DIAGNOSIS — M533 Sacrococcygeal disorders, not elsewhere classified: Secondary | ICD-10-CM | POA: Diagnosis not present

## 2023-05-03 DIAGNOSIS — M5416 Radiculopathy, lumbar region: Secondary | ICD-10-CM | POA: Diagnosis not present

## 2023-05-06 ENCOUNTER — Other Ambulatory Visit: Payer: Self-pay

## 2023-05-07 ENCOUNTER — Telehealth: Payer: Commercial Managed Care - PPO | Admitting: Physician Assistant

## 2023-05-07 DIAGNOSIS — Z20822 Contact with and (suspected) exposure to covid-19: Secondary | ICD-10-CM

## 2023-05-07 NOTE — Progress Notes (Signed)
Awaiting patient home COVID test results.

## 2023-05-07 NOTE — Progress Notes (Signed)
Message sent to patient requesting further input regarding current symptoms. Awaiting patient response.  

## 2023-05-15 ENCOUNTER — Other Ambulatory Visit (HOSPITAL_COMMUNITY): Payer: Self-pay

## 2023-06-14 ENCOUNTER — Other Ambulatory Visit: Payer: Self-pay

## 2023-06-24 DIAGNOSIS — I10 Essential (primary) hypertension: Secondary | ICD-10-CM | POA: Diagnosis not present

## 2023-06-24 DIAGNOSIS — E1165 Type 2 diabetes mellitus with hyperglycemia: Secondary | ICD-10-CM | POA: Diagnosis not present

## 2023-06-24 DIAGNOSIS — U071 COVID-19: Secondary | ICD-10-CM | POA: Diagnosis not present

## 2023-06-24 DIAGNOSIS — Z1152 Encounter for screening for COVID-19: Secondary | ICD-10-CM | POA: Diagnosis not present

## 2023-07-09 DIAGNOSIS — J209 Acute bronchitis, unspecified: Secondary | ICD-10-CM | POA: Diagnosis not present

## 2023-07-09 DIAGNOSIS — B9689 Other specified bacterial agents as the cause of diseases classified elsewhere: Secondary | ICD-10-CM | POA: Diagnosis not present

## 2023-07-09 DIAGNOSIS — J019 Acute sinusitis, unspecified: Secondary | ICD-10-CM | POA: Diagnosis not present

## 2023-07-25 ENCOUNTER — Emergency Department: Payer: Commercial Managed Care - PPO

## 2023-07-25 ENCOUNTER — Emergency Department
Admission: EM | Admit: 2023-07-25 | Discharge: 2023-07-25 | Disposition: A | Discharge status: Home / Self Care | Payer: Commercial Managed Care - PPO | Attending: Emergency Medicine | Admitting: Emergency Medicine

## 2023-07-25 ENCOUNTER — Other Ambulatory Visit: Payer: Self-pay

## 2023-07-25 DIAGNOSIS — M1712 Unilateral primary osteoarthritis, left knee: Secondary | ICD-10-CM | POA: Diagnosis not present

## 2023-07-25 DIAGNOSIS — J45909 Unspecified asthma, uncomplicated: Secondary | ICD-10-CM | POA: Diagnosis not present

## 2023-07-25 DIAGNOSIS — M25562 Pain in left knee: Secondary | ICD-10-CM | POA: Diagnosis not present

## 2023-07-25 DIAGNOSIS — I10 Essential (primary) hypertension: Secondary | ICD-10-CM | POA: Diagnosis not present

## 2023-07-25 DIAGNOSIS — M79605 Pain in left leg: Secondary | ICD-10-CM | POA: Diagnosis not present

## 2023-07-25 DIAGNOSIS — R600 Localized edema: Secondary | ICD-10-CM | POA: Diagnosis not present

## 2023-07-25 DIAGNOSIS — E119 Type 2 diabetes mellitus without complications: Secondary | ICD-10-CM | POA: Diagnosis not present

## 2023-07-25 MED ORDER — IBUPROFEN 600 MG PO TABS: 600.0000 mg | ORAL_TABLET | Freq: Three times a day (TID) | ORAL | 0 refills | Status: DC | PRN

## 2023-07-25 MED ORDER — HYDROCODONE-ACETAMINOPHEN 5-325 MG PO TABS: 1.0000 | ORAL_TABLET | Freq: Four times a day (QID) | ORAL | 0 refills | Status: DC | PRN

## 2023-07-25 NOTE — ED Triage Notes (Signed)
Pt to ED for left leg pain started today, pain behind knee. Worsening pain with ambulation.

## 2023-07-25 NOTE — ED Provider Notes (Signed)
[facility] Provider Note    Event Date/Time   First MD Initiated Contact with Patient 07/25/23 [phone]     (approximate)   History   No chief complaint on file.   HPI  Yvette Perry is a 47 y.o. female   presents to the ED with complaint of left leg pain that started today with the majority of her pain posterior knee.  Patient reports worsening of pain with ambulation and weightbearing.  Patient denies any recent injuries, smoking, hormone therapy, recent travel or past DVTs.  No over-the-counter medications been taken as the pain worsened when she was getting out of her car this morning.  Patient has a history of migraines, diabetes, asthma, hypertension.      Physical Exam   Triage Vital Signs: ED Triage Vitals  Encounter Vitals Group     BP 07/25/23 0904 135/77     Systolic BP Percentile --      Diastolic BP Percentile --      Pulse Rate 07/25/23 0904 88     Resp 07/25/23 0904 18     Temp 07/25/23 0904 98 F (36.7 C)     Temp Source 07/25/23 0904 Oral     SpO2 07/25/23 0904 98 %     Weight 07/25/23 0900 300 lb (136.1 kg)     Height 07/25/23 0900 5\' 7"  (1.702 m)     Head Circumference --      Peak Flow --      Pain Score 07/25/23 0900 10     Pain Loc --      Pain Education --      Exclude from Growth Chart --     Most recent vital signs: Vitals:   07/25/23 0904  BP: 135/77  Pulse: 88  Resp: 18  Temp: 98 F (36.7 C)  SpO2: 98%     General: Awake, no distress.  CV:  Good peripheral perfusion.  Resp:  Normal effort.  Lungs are clear bilaterally. Abd:  No distention.  Other:  Generalized tenderness on palpation of the left knee both anteriorly and posteriorly.  There is some pitting edema noted to the lower extremities bilaterally.  No warmth or redness is noted to the left lower extremity.  Range of motion is guarded secondary to increased pain.  Skin is intact.  Pulses are present both DP and PT.   ED Results / Procedures /  Treatments   Labs (all labs ordered are listed, but only abnormal results are displayed) Labs Reviewed - No data to display   RADIOLOGY  Left knee x-ray images were reviewed and interpreted by myself independent of the radiologist with some mild degenerative changes noted but no fracture or dislocation noted.  Ultrasound venous left lower extremity per radiologist is negative for DVT.   PROCEDURES:  Critical Care performed:   Procedures   MEDICATIONS ORDERED IN ED: Medications - No data to display   IMPRESSION / MDM / ASSESSMENT AND PLAN / ED COURSE  I reviewed the triage vital signs and the nursing notes.   Differential diagnosis includes, but is not limited to, left knee pain, left knee strain, left lower extremity pain.  Osteoarthritis, DVT.  47 year old female presents to the ED with complaint of left lower extremity pain that occurred when getting out of her car this morning.  Patient states that she had difficulty bearing weight.  She denies any previous injury to her knee or risk factors that would suggest a DVT although  she does have some pitting edema present bilateral lower extremities.  Ultrasound was negative for DVT and reassuring as was her x-ray which showed some mild degenerative changes.  Patient is instructed to ice and elevate her leg the remainder of today.  An Ace wrap was used and patient was given crutches for added support and protection.  A prescription for hydrocodone and ibuprofen was sent to the pharmacy for her to begin taking when she gets home.  She is to follow-up with her PCP if any continued problems.      Patient's presentation is most consistent with acute complicated illness / injury requiring diagnostic workup.  FINAL CLINICAL IMPRESSION(S) / ED DIAGNOSES   Final diagnoses:  Acute pain of left knee     Rx / DC Orders   ED Discharge Orders          Ordered    HYDROcodone-acetaminophen (NORCO/VICODIN) 5-325 MG tablet  Every 6 hours  PRN        07/25/23 1217    ibuprofen (ADVIL) 600 MG tablet  Every 8 hours PRN        07/25/23 1217             Note:  This document was prepared using Dragon voice recognition software and may include unintentional dictation errors.   Tommi Rumps, PA-C 07/25/23 1511    Merwyn Katos, MD 07/25/23 [phone]

## 2023-07-25 NOTE — Discharge Instructions (Signed)
Follow-up with your primary care provider if any continued problems or concerns.  Ice and elevation for today.  Use Ace wrap for support and crutches as needed.  A prescription for ibuprofen for inflammation and hydrocodone was sent to your pharmacy to take.  Do not drive or operate machinery while taking the hydrocodone as it could cause drowsiness.

## 2023-08-14 ENCOUNTER — Other Ambulatory Visit (HOSPITAL_COMMUNITY): Payer: Self-pay

## 2023-08-28 ENCOUNTER — Other Ambulatory Visit: Payer: Self-pay

## 2023-08-28 MED ORDER — LISINOPRIL-HYDROCHLOROTHIAZIDE 20-12.5 MG PO TABS
2.0000 | ORAL_TABLET | Freq: Every day | ORAL | 1 refills | Status: AC
  Filled 2023-08-28: qty 180, 90d supply, fill #0
  Filled 2023-12-17: qty 180, 90d supply, fill #1

## 2023-08-28 MED ORDER — PANTOPRAZOLE SODIUM 40 MG PO TBEC
40.0000 mg | DELAYED_RELEASE_TABLET | Freq: Every morning | ORAL | 1 refills | Status: DC
  Filled 2023-08-28: qty 90, 90d supply, fill #0
  Filled 2023-12-17: qty 90, 90d supply, fill #1

## 2023-08-28 MED ORDER — GABAPENTIN 400 MG PO CAPS
400.0000 mg | ORAL_CAPSULE | Freq: Two times a day (BID) | ORAL | 2 refills | Status: DC
  Filled 2023-08-28: qty 60, 30d supply, fill #0
  Filled 2023-10-15: qty 60, 30d supply, fill #1
  Filled 2023-11-20: qty 60, 30d supply, fill #2

## 2023-08-28 MED ORDER — MONTELUKAST SODIUM 10 MG PO TABS
10.0000 mg | ORAL_TABLET | Freq: Every day | ORAL | 1 refills | Status: AC
  Filled 2023-08-28: qty 90, 90d supply, fill #0
  Filled 2023-12-17: qty 90, 90d supply, fill #1

## 2023-08-30 ENCOUNTER — Other Ambulatory Visit: Payer: Self-pay

## 2023-09-10 ENCOUNTER — Other Ambulatory Visit: Payer: Self-pay

## 2023-09-11 ENCOUNTER — Other Ambulatory Visit: Payer: Self-pay

## 2023-10-15 ENCOUNTER — Other Ambulatory Visit: Payer: Self-pay

## 2023-10-17 ENCOUNTER — Other Ambulatory Visit: Payer: Self-pay

## 2023-10-17 MED ORDER — FLUTICASONE PROPIONATE 50 MCG/ACT NA SUSP
2.0000 | Freq: Two times a day (BID) | NASAL | 5 refills | Status: AC
  Filled 2023-10-17: qty 16, 30d supply, fill #0
  Filled 2023-12-17 – 2024-01-27 (×2): qty 16, 30d supply, fill #1
  Filled 2024-03-25: qty 16, 30d supply, fill #2
  Filled 2024-05-18: qty 16, 30d supply, fill #3
  Filled 2024-08-06: qty 16, 30d supply, fill #4

## 2023-10-17 MED ORDER — VITAMIN D3 25 MCG (1000 UNIT) PO TABS
1000.0000 [IU] | ORAL_TABLET | Freq: Every day | ORAL | 3 refills | Status: DC
  Filled 2023-10-17: qty 100, 100d supply, fill #0
  Filled 2024-01-27: qty 90, 90d supply, fill #1
  Filled 2024-05-18: qty 90, 90d supply, fill #2
  Filled 2024-09-14: qty 90, 90d supply, fill #3

## 2023-10-17 MED ORDER — PROCHLORPERAZINE MALEATE 10 MG PO TABS
5.0000 mg | ORAL_TABLET | Freq: Four times a day (QID) | ORAL | 1 refills | Status: AC | PRN
  Filled 2023-10-17: qty 15, 8d supply, fill #0

## 2023-10-17 MED ORDER — METFORMIN HCL ER 500 MG PO TB24
1000.0000 mg | ORAL_TABLET | Freq: Two times a day (BID) | ORAL | 1 refills | Status: AC
  Filled 2023-10-17: qty 360, 90d supply, fill #0
  Filled 2024-01-27: qty 360, 90d supply, fill #1

## 2023-10-17 MED ORDER — COMBIVENT RESPIMAT 20-100 MCG/ACT IN AERS
2.0000 | INHALATION_SPRAY | Freq: Four times a day (QID) | RESPIRATORY_TRACT | 11 refills | Status: AC | PRN
  Filled 2023-10-17: qty 4, 15d supply, fill #0
  Filled 2024-01-27: qty 4, 15d supply, fill #2
  Filled 2024-02-25: qty 4, 15d supply, fill #3

## 2023-10-17 MED ORDER — GABAPENTIN 300 MG PO CAPS
600.0000 mg | ORAL_CAPSULE | Freq: Every day | ORAL | 2 refills | Status: DC
  Filled 2023-10-17: qty 60, 30d supply, fill #0
  Filled 2024-01-27: qty 60, 30d supply, fill #1
  Filled 2024-03-25: qty 60, 30d supply, fill #2

## 2023-10-20 ENCOUNTER — Ambulatory Visit
Admission: EM | Admit: 2023-10-20 | Discharge: 2023-10-20 | Disposition: A | Discharge status: Home / Self Care | Payer: Commercial Managed Care - PPO | Attending: Emergency Medicine | Admitting: Emergency Medicine

## 2023-10-20 ENCOUNTER — Encounter: Payer: Self-pay | Admitting: *Deleted

## 2023-10-20 ENCOUNTER — Other Ambulatory Visit: Payer: Self-pay

## 2023-10-20 DIAGNOSIS — J069 Acute upper respiratory infection, unspecified: Secondary | ICD-10-CM | POA: Diagnosis not present

## 2023-10-20 DIAGNOSIS — R062 Wheezing: Secondary | ICD-10-CM | POA: Diagnosis not present

## 2023-10-20 LAB — POCT RAPID STREP A (OFFICE): Rapid Strep A Screen: NEGATIVE

## 2023-10-20 LAB — POC COVID19/FLU A&B COMBO
Covid Antigen, POC: NEGATIVE
Influenza A Antigen, POC: NEGATIVE
Influenza B Antigen, POC: NEGATIVE

## 2023-10-20 MED ORDER — PREDNISONE 10 MG (21) PO TBPK: ORAL_TABLET | Freq: Every day | ORAL | 0 refills | Status: DC

## 2023-10-20 MED ORDER — BENZONATATE 100 MG PO CAPS: 100.0000 mg | ORAL_CAPSULE | Freq: Three times a day (TID) | ORAL | 0 refills | Status: DC

## 2023-10-20 NOTE — ED Provider Notes (Signed)
 Yvette Perry    CSN: [id number] Arrival date & time: 10/20/23  0920      History   Chief Complaint Chief Complaint  Patient presents with   Cough   Sore Throat    HPI Yvette Perry is a 49 y.o. female.   Patient presents for evaluation of nasal congestion, rhinorrhea, productive cough with green sputum, sore throat, intermittent generalized headaches, right-sided ear pain and wheezing present for 2 to 3 days.  Known exposure to strep from family.  Has been using inhalers with some improvement but symptoms have persisted.  Denies fever or abdominal pain.  Tolerating food and liquids.  Past Medical History:  Diagnosis Date   Abnormal Pap smear of cervix    LGSIL   Asthma    Diabetes mellitus (HCC)    Goiter    Hypertension    Migraine    Pre-diabetes    Uterine fibroid     Patient Active Problem List   Diagnosis Date Noted   Abnormal uterine bleeding (AUB) 09/18/2022   Abscess of chest wall 02/01/2022   Cellulitis 01/31/2022   Migraine    Diabetes mellitus (HCC)    Asthma    Hypertension    Obesity, Class III, BMI 40-49.9 (morbid obesity) (HCC)    Hypokalemia    Normocytic anemia    Migraine without aura and without status migrainosus, not intractable 04/04/2021    Past Surgical History:  Procedure Laterality Date   CESAREAN SECTION  06/2003   COLPOSCOPY  12/22/2008   normal   DILITATION & CURRETTAGE/HYSTROSCOPY WITH NOVASURE ABLATION N/A 09/18/2022   Procedure: DILATATION & CURETTAGE/HYSTEROSCOPY WITH NOVASURE ABLATION;  Surgeon: Corene Coy, MD;  [facility];  Service: Gynecology;  Laterality: N/A;   ENDOMETRIAL BIOPSY  06/30/2013   benign   INCISION AND DRAINAGE ABSCESS Right 02/02/2022   Procedure: INCISION AND DRAINAGE ABSCESS RIGHT CHEST WALL;  Surgeon: Curvin Mt III, MD;  Location: [facility];  Service: General;  Laterality: Right;  LATERAL/PT ATE AT 0930   TUBAL LIGATION  06/2003    OB History      Gravida  4   Para  4   Term  3   Preterm  1   AB      Living  4      SAB      IAB      Ectopic      Multiple  1   Live Births  4            Home Medications    Prior to Admission medications   Medication Sig Start Date End Date Taking? Authorizing Provider  ascorbic acid (VITAMIN C) 500 MG tablet Take 500 mg by mouth 3 (three) times daily. With rose hips   Yes [provider]  B Complex-C-Biotin-D-Zinc-FA (VITAL-D RX PO) Take 1,000 mg by mouth daily.   Yes [provider]  benzonatate  (TESSALON ) 100 MG capsule Take 1 capsule (100 mg total) by mouth every 8 (eight) hours. 10/20/23  Yes Maurice Ramseur, Yvette SAUNDERS, NP  butalbital -acetaminophen -caffeine  (FIORICET  WITH CODEINE) 50-325-40-30 MG capsule Take 1 capsule by mouth every 4 (four) hours as needed for headache.   Yes [provider]  cholecalciferol  (VITAMIN D3) 25 MCG (1000 UNIT) tablet Take 1 tablet (1,000 Units total) by mouth daily. 10/17/23  Yes   fexofenadine (ALLEGRA) 180 MG tablet Take 180 mg by mouth daily.   Yes [provider]  fluticasone  (FLONASE ) 50 MCG/ACT nasal spray Shake  liquid and Place 2 sprays into both nostrils 2 (two) times daily. 10/17/23  Yes   fluticasone -salmeterol (ADVAIR ) 500-50 MCG/ACT AEPB Inhale 1 puff into the lungs in the morning and at bedtime.   Yes [provider]  gabapentin  (NEURONTIN ) 300 MG capsule Take 2 capsules (600 mg total) by mouth at bedtime. 02/13/23  Yes   gabapentin  (NEURONTIN ) 400 MG capsule Take 1 capsule (400 mg total) by mouth 2 (two) times daily in the morning and in the afternoon. 08/28/23  Yes   Ipratropium-Albuterol  (COMBIVENT  RESPIMAT) 20-100 MCG/ACT AERS respimat INHALE 2 PUFFS INTO THE LUNGS EVERY 6 HOURS AS NEEDED 10/17/23  Yes   lisinopril -hydrochlorothiazide  (ZESTORETIC ) 20-12.5 MG tablet Take 2 tablets by mouth daily. 08/28/23  Yes   metFORMIN  (GLUCOPHAGE -XR) 500 MG 24 hr tablet Take 2 tablets (1,000 mg total) by mouth 2 (two)  times daily. 10/17/23  Yes   montelukast  (SINGULAIR ) 10 MG tablet Take 1 tablet (10 mg total) by mouth at bedtime. 08/28/23  Yes   pantoprazole  (PROTONIX ) 40 MG tablet Take 1 tablet (40 mg total) by mouth every morning. 08/28/23  Yes   predniSONE  (STERAPRED UNI-PAK 21 TAB) 10 MG (21) TBPK tablet Take by mouth daily. Take 6 tabs by mouth daily  for 1 days, then 5 tabs for 1 days, then 4 tabs for 1 days, then 3 tabs for 1 days, 2 tabs for 1 days, then 1 tab by mouth daily for 1 days 10/20/23  Yes Ling Flesch R, NP  albuterol  (PROVENTIL  HFA;VENTOLIN  HFA) 108 (90 BASE) MCG/ACT inhaler Inhale 2 puffs into the lungs every 6 (six) hours as needed. For shortness of breath  Patient not taking: Reported on 01/31/2022    [provider]  amoxicillin -clavulanate (AUGMENTIN ) 875-125 MG tablet Take 1 tablet by mouth 2 (two) times daily. 02/05/23   Gladis Elsie BROCKS, PA-C  cholecalciferol  (VITAMIN D3) 25 MCG (1000 UT) tablet Take 1,000 Units by mouth daily.    [provider]  diphenhydrAMINE  (BENADRYL ) 25 MG tablet Take by mouth.    [provider]  EPINEPHrine  (EPIPEN  2-PAK) 0.3 mg/0.3 mL IJ SOAJ injection Inject 0.3 mLs (0.3 mg total) into the muscle once as needed (allergic reaction). Patient taking differently: Inject 0.3 mg into the muscle once as needed for anaphylaxis (allergic reaction). 05/12/16   Carlota Day, MD  ferrous sulfate  325 (65 FE) MG tablet Take 325 mg by mouth 3 (three) times daily with meals.    [provider]  fluconazole  (DIFLUCAN ) 150 MG tablet Take 1 tablet by mouth once. Repeat in 3 days if needed. 02/05/23   Gladis Elsie BROCKS, PA-C  fluticasone  (FLONASE ) 50 MCG/ACT nasal spray Place 1 spray into the nose daily.     [provider]  fluticasone  (FLONASE ) 50 MCG/ACT nasal spray Shake liquid and Place 2 sprays into both nostrils 2 (two) times daily. 01/09/23     fluticasone -salmeterol (ADVAIR ) 500-50 MCG/ACT AEPB Inhale 1 puff into the lungs 2 (two)  times daily. 01/09/23     gabapentin  (NEURONTIN ) 300 MG capsule Take 300 mg by mouth as needed (moderate pain). 01/18/22   [provider]  gabapentin  (NEURONTIN ) 300 MG capsule Take 2 capsules (600 mg total) by mouth at bedtime. 10/17/23     HYDROcodone -acetaminophen  (NORCO/VICODIN) 5-325 MG tablet Take 1 tablet by mouth every 6 (six) hours as needed. 07/25/23 07/24/24  Perry Shona CROME, PA-C  ibuprofen  (ADVIL ) 600 MG tablet Take 1 tablet (600 mg total) by mouth every 8 (eight) hours as  needed for mild pain or moderate pain. 07/25/23   Summers, Rhonda L, PA-C  ipratropium (ATROVENT ) 0.03 % nasal spray Place 2 sprays into both nostrils 3 (three) times daily.    [provider]  Ipratropium-Albuterol  (COMBIVENT  RESPIMAT) 20-100 MCG/ACT AERS respimat Inhale 2 puffs into the lungs every 6 (six) hours as needed. 11/26/17   [provider]  Ipratropium-Albuterol  (COMBIVENT  RESPIMAT) 20-100 MCG/ACT AERS respimat Inhale 2 puffs into the lungs every 6 (six) hours as needed for wheezing 02/20/23     lisinopril -hydrochlorothiazide  (ZESTORETIC ) 20-12.5 MG tablet Take 2 tablets by mouth daily. 02/05/22 09/18/22  Christobal Guadalajara, MD  lisinopril -hydrochlorothiazide  (ZESTORETIC ) 20-12.5 MG tablet Take two tablets by mouth daily. 02/12/23     metFORMIN  (GLUCOPHAGE -XR) 500 MG 24 hr tablet Take 1,000 mg by mouth in the morning and at bedtime.    [provider]  montelukast  (SINGULAIR ) 10 MG tablet Take 10 mg by mouth at bedtime.    [provider]  ondansetron  (ZOFRAN -ODT) 4 MG disintegrating tablet Take 1-2 tablets (4-8 mg total) by mouth every 8 (eight) hours as needed. 04/04/21   Ines Onetha NOVAK, MD  prochlorperazine  (COMPAZINE ) 10 MG tablet Take 10 mg by mouth every 6 (six) hours as needed for nausea or vomiting.    [provider]  prochlorperazine  (COMPAZINE ) 10 MG tablet Take 0.5 tablets (5 mg total) by mouth every 6 (six) hours as needed for nausea or headache. 10/17/23      rizatriptan  (MAXALT -MLT) 10 MG disintegrating tablet Take 1 tablet (10 mg total) by mouth as needed for migraine. May repeat in 2 hours if needed Patient not taking: Reported on 02/28/2022 04/04/21   Ines Onetha NOVAK, MD  topiramate  (TOPAMAX ) 50 MG tablet Start with one pill at bedtime(50mg ). In 2 weeks, if no side effects, increase to 2 pills (100mg ) at bedtime Patient not taking: Reported on 11/14/2022 04/04/21   Ines Onetha NOVAK, MD    Family History Family History  Problem Relation Age of Onset   Hypertension Father    Diabetes Father    Kidney failure Father    Hypertension Mother     Social History Social History   Tobacco Use   Smoking status: Never   Smokeless tobacco: Never  Vaping Use   Vaping status: Never Used  Substance Use Topics   Alcohol use: Not Currently    Comment: rarely   Drug use: No     Allergies   Indian bread [wolfiporia cocos]   Review of Systems Review of Systems   Physical Exam Triage Vital Signs ED Triage Vitals  Encounter Vitals Group     BP 10/20/23 1053 (!) 170/104     Systolic BP Percentile --      Diastolic BP Percentile --      Pulse Rate 10/20/23 1053 78     Resp 10/20/23 1053 20     Temp 10/20/23 1053 98 F (36.7 C)     Temp Source 10/20/23 1053 Oral     SpO2 10/20/23 1053 93 %     Weight --      Height --      Head Circumference --      Peak Flow --      Pain Score 10/20/23 1054 6     Pain Loc --      Pain Education --      Exclude from Growth Chart --    No data found.  Updated Vital Signs BP (!) 170/104 Comment: states has not  taken HTN med today  Pulse 78   Temp 98 F (36.7 C) (Oral)   Resp 20   SpO2 93%   Visual Acuity Right Eye Distance:   Left Eye Distance:   Bilateral Distance:    Right Eye Near:   Left Eye Near:    Bilateral Near:     Physical Exam Constitutional:      Appearance: Normal appearance.  HENT:     Head: Normocephalic.     Right Ear: Ear canal and external ear normal. A middle  ear effusion is present.     Left Ear: Ear canal and external ear normal. A middle ear effusion is present.     Nose: Congestion present. No rhinorrhea.     Mouth/Throat:     Pharynx: Posterior oropharyngeal erythema present. No oropharyngeal exudate.  Eyes:     Extraocular Movements: Extraocular movements intact.  Cardiovascular:     Rate and Rhythm: Normal rate and regular rhythm.     Pulses: Normal pulses.     Heart sounds: Normal heart sounds.  Pulmonary:     Effort: Pulmonary effort is normal.     Comments: Wheezing present to the bilateral upper lobes lower lobes clear Musculoskeletal:     Cervical back: Normal range of motion.  Lymphadenopathy:     Cervical: Cervical adenopathy present.  Neurological:     Mental Status: She is alert and oriented to person, place, and time. Mental status is at baseline.      UC Treatments / Results  Labs (all labs ordered are listed, but only abnormal results are displayed) Labs Reviewed  POCT RAPID STREP A (OFFICE)  POC COVID19/FLU A&B COMBO    EKG   Radiology No results found.  Procedures Procedures (including critical care time)  Medications Ordered in UC Medications - No data to display  Initial Impression / Assessment and Plan / UC Course  I have reviewed the triage vital signs and the nursing notes.  Pertinent labs & imaging results that were available during my care of the patient were reviewed by me and considered in my medical decision making (see chart for details).  Viral URI with cough, wheezing  Patient is in no signs of distress nor toxic appearing.  Vital signs are stable.  Low suspicion for pneumonia, pneumothorax or bronchitis and therefore will defer imaging.  COVID, flu and strep testing negative.  Prescribed prednisone  and Tessalon , recommended continued use of inhalers as directed, patient endorsed that she needs no refills at this time.May use additional over-the-counter medications as needed for  supportive care.  May follow-up with urgent care as needed if symptoms persist or worsen.     Final Clinical Impressions(s) / UC Diagnoses   Final diagnoses:  Viral URI with cough  Wheezing     Discharge Instructions      Your symptoms today are most likely being caused by a virus and should steadily improve in time it can take up to 7 to 10 days before you truly start to see a turnaround however things will get better  COVID, flu and strep test negative  As your wheezing on exam begin prednisone  every morning with food to open and relax the airway, continue inhalers as needed  You may use Tessalon  pill every 8 hours throughout the day to help calm your cough, will not make you drowsy    You can take Tylenol  and/or Ibuprofen  as needed for fever reduction and pain relief.   For cough: honey 1/2 to  1 teaspoon (you can dilute the honey in water or another fluid).  You can also use guaifenesin and dextromethorphan for cough. You can use a humidifier for chest congestion and cough.  If you don't have a humidifier, you can sit in the bathroom with the hot shower running.      For sore throat: try warm salt water gargles, cepacol lozenges, throat spray, warm tea or water with lemon/honey, popsicles or ice, or OTC cold relief medicine for throat discomfort.   For congestion: take a daily anti-histamine like Zyrtec, Claritin , and a oral decongestant, such as pseudoephedrine.  You can also use Flonase  1-2 sprays in each nostril daily.   It is important to stay hydrated: drink plenty of fluids (water, gatorade/powerade/pedialyte, juices, or teas) to keep your throat moisturized and help further relieve irritation/discomfort.    ED Prescriptions     Medication Sig Dispense Auth. Provider   predniSONE  (STERAPRED UNI-PAK 21 TAB) 10 MG (21) TBPK tablet Take by mouth daily. Take 6 tabs by mouth daily  for 1 days, then 5 tabs for 1 days, then 4 tabs for 1 days, then 3 tabs for 1 days, 2 tabs for  1 days, then 1 tab by mouth daily for 1 days 21 tablet Eljay Lave R, NP   benzonatate  (TESSALON ) 100 MG capsule Take 1 capsule (100 mg total) by mouth every 8 (eight) hours. 21 capsule Yvette Perry, Yvette SAUNDERS, NP      PDMP not reviewed this encounter.   Teresa Yvette SAUNDERS, NP 10/20/23 1248

## 2023-10-20 NOTE — Discharge Instructions (Signed)
 Your symptoms today are most likely being caused by a virus and should steadily improve in time it can take up to 7 to 10 days before you truly start to see a turnaround however things will get better  COVID, flu and strep test negative  As your wheezing on exam begin prednisone  every morning with food to open and relax the airway, continue inhalers as needed  You may use Tessalon  pill every 8 hours throughout the day to help calm your cough, will not make you drowsy    You can take Tylenol  and/or Ibuprofen  as needed for fever reduction and pain relief.   For cough: honey 1/2 to 1 teaspoon (you can dilute the honey in water or another fluid).  You can also use guaifenesin and dextromethorphan for cough. You can use a humidifier for chest congestion and cough.  If you don't have a humidifier, you can sit in the bathroom with the hot shower running.      For sore throat: try warm salt water gargles, cepacol lozenges, throat spray, warm tea or water with lemon/honey, popsicles or ice, or OTC cold relief medicine for throat discomfort.   For congestion: take a daily anti-histamine like Zyrtec, Claritin , and a oral decongestant, such as pseudoephedrine.  You can also use Flonase  1-2 sprays in each nostril daily.   It is important to stay hydrated: drink plenty of fluids (water, gatorade/powerade/pedialyte, juices, or teas) to keep your throat moisturized and help further relieve irritation/discomfort.

## 2023-10-20 NOTE — ED Triage Notes (Signed)
 C/O sore throat, cough, nasal congestion onset 2 days ago. States several in-home family members had strep last wk. Pt wishes to be checked for strep, Covid, and influenza. No known fevers. Has been taking Flonase , Combivent  and Advair , left-over Rx cough med.

## 2023-10-21 ENCOUNTER — Other Ambulatory Visit: Payer: Self-pay

## 2023-10-21 ENCOUNTER — Telehealth: Payer: Commercial Managed Care - PPO | Admitting: Physician Assistant

## 2023-10-21 DIAGNOSIS — B9689 Other specified bacterial agents as the cause of diseases classified elsewhere: Secondary | ICD-10-CM | POA: Diagnosis not present

## 2023-10-21 DIAGNOSIS — J019 Acute sinusitis, unspecified: Secondary | ICD-10-CM | POA: Diagnosis not present

## 2023-10-21 DIAGNOSIS — H109 Unspecified conjunctivitis: Secondary | ICD-10-CM

## 2023-10-21 MED ORDER — DOXYCYCLINE HYCLATE 100 MG PO TABS
100.0000 mg | ORAL_TABLET | Freq: Two times a day (BID) | ORAL | 0 refills | Status: DC
  Filled 2023-10-21: qty 20, 10d supply, fill #0

## 2023-10-21 MED ORDER — MOXIFLOXACIN HCL 0.5 % OP SOLN
1.0000 [drp] | Freq: Three times a day (TID) | OPHTHALMIC | 0 refills | Status: DC
  Filled 2023-10-21: qty 3, 20d supply, fill #0

## 2023-10-21 NOTE — Progress Notes (Signed)

## 2023-10-22 ENCOUNTER — Other Ambulatory Visit (HOSPITAL_COMMUNITY): Payer: Self-pay

## 2023-12-17 ENCOUNTER — Other Ambulatory Visit: Payer: Self-pay

## 2023-12-17 MED ORDER — GABAPENTIN 400 MG PO CAPS
400.0000 mg | ORAL_CAPSULE | Freq: Two times a day (BID) | ORAL | 2 refills | Status: DC
  Filled 2023-12-17: qty 60, 30d supply, fill #0
  Filled 2024-03-25: qty 60, 30d supply, fill #1
  Filled 2024-05-18: qty 60, 30d supply, fill #2

## 2023-12-27 ENCOUNTER — Other Ambulatory Visit: Payer: Self-pay

## 2024-01-01 ENCOUNTER — Telehealth: Admitting: Physician Assistant

## 2024-01-01 ENCOUNTER — Other Ambulatory Visit: Payer: Self-pay

## 2024-01-01 DIAGNOSIS — T300 Burn of unspecified body region, unspecified degree: Secondary | ICD-10-CM

## 2024-01-01 DIAGNOSIS — J45901 Unspecified asthma with (acute) exacerbation: Secondary | ICD-10-CM

## 2024-01-01 MED ORDER — PREDNISONE 20 MG PO TABS
40.0000 mg | ORAL_TABLET | Freq: Every day | ORAL | 0 refills | Status: DC
  Filled 2024-01-01: qty 10, 5d supply, fill #0

## 2024-01-01 MED ORDER — SILVER SULFADIAZINE 1 % EX CREA
1.0000 | TOPICAL_CREAM | Freq: Every day | CUTANEOUS | 0 refills | Status: DC
  Filled 2024-01-01: qty 50, 30d supply, fill #0

## 2024-01-01 NOTE — Patient Instructions (Signed)
 Garret Reddish, thank you for joining Piedad Climes, PA-C for today's virtual visit.  While this provider is not your primary care provider (PCP), if your PCP is located in our provider database this encounter information will be shared with them immediately following your visit.   A [facility] MyChart account gives you access to today's visit and all your visits, tests, and labs performed at [facility] " click here if you don't have a [facility] MyChart account or go to [email or web address]  Consent: (Patient) Yvette Perry provided verbal consent for this virtual visit at the beginning of the encounter.  Current Medications:  Current Outpatient Medications:    albuterol (PROVENTIL HFA;VENTOLIN HFA) 108 (90 BASE) MCG/ACT inhaler, Inhale 2 puffs into the lungs every 6 (six) hours as needed. For shortness of breath  (Patient not taking: Reported on 01/31/2022), Disp: , Rfl:    ascorbic acid (VITAMIN C) 500 MG tablet, Take 500 mg by mouth 3 (three) times daily. With rose hips, Disp: , Rfl:    B Complex-C-Biotin-D-Zinc-FA (VITAL-D RX PO), Take 1,000 mg by mouth daily., Disp: , Rfl:    benzonatate (TESSALON) 100 MG capsule, Take 1 capsule (100 mg total) by mouth every 8 (eight) hours., Disp: 21 capsule, Rfl: 0   butalbital-acetaminophen-caffeine (FIORICET WITH CODEINE) 50-325-40-30 MG capsule, Take 1 capsule by mouth every 4 (four) hours as needed for headache., Disp: , Rfl:    cholecalciferol (VITAMIN D3) 25 MCG (1000 UNIT) tablet, Take 1 tablet (1,000 Units total) by mouth daily., Disp: 100 tablet, Rfl: 3   cholecalciferol (VITAMIN D3) 25 MCG (1000 UT) tablet, Take 1,000 Units by mouth daily., Disp: , Rfl:    diphenhydrAMINE (BENADRYL) 25 MG tablet, Take by mouth., Disp: , Rfl:    doxycycline (VIBRA-TABS) 100 MG tablet, Take 1 tablet (100 mg total) by mouth 2 (two) times daily., Disp: 20 tablet, Rfl: 0   EPINEPHrine (EPIPEN 2-PAK) 0.3 mg/0.3 mL IJ SOAJ injection, Inject  0.3 mLs (0.3 mg total) into the muscle once as needed (allergic reaction). (Patient taking differently: Inject 0.3 mg into the muscle once as needed for anaphylaxis (allergic reaction).), Disp: 2 Device, Rfl: 0   ferrous sulfate 325 (65 FE) MG tablet, Take 325 mg by mouth 3 (three) times daily with meals., Disp: , Rfl:    fexofenadine (ALLEGRA) 180 MG tablet, Take 180 mg by mouth daily., Disp: , Rfl:    fluconazole (DIFLUCAN) 150 MG tablet, Take 1 tablet by mouth once. Repeat in 3 days if needed., Disp: 2 tablet, Rfl: 0   fluticasone (FLONASE) 50 MCG/ACT nasal spray, Place 1 spray into the nose daily. , Disp: , Rfl:    fluticasone (FLONASE) 50 MCG/ACT nasal spray, Shake liquid and Place 2 sprays into both nostrils 2 (two) times daily., Disp: 16 g, Rfl: 5   fluticasone (FLONASE) 50 MCG/ACT nasal spray, Shake liquid and Place 2 sprays into both nostrils 2 (two) times daily., Disp: 16 g, Rfl: 5   fluticasone-salmeterol (ADVAIR) 500-50 MCG/ACT AEPB, Inhale 1 puff into the lungs in the morning and at bedtime., Disp: , Rfl:    fluticasone-salmeterol (ADVAIR) 500-50 MCG/ACT AEPB, Inhale 1 puff into the lungs 2 (two) times daily., Disp: 60 each, Rfl: 11   gabapentin (NEURONTIN) 300 MG capsule, Take 300 mg by mouth as needed (moderate pain)., Disp: , Rfl:    gabapentin (NEURONTIN) 300 MG capsule, Take 2 capsules (600 mg total) by mouth at bedtime., Disp: 60 capsule, Rfl: 2   gabapentin (  NEURONTIN) 300 MG capsule, Take 2 capsules (600 mg total) by mouth at bedtime., Disp: 60 capsule, Rfl: 2   gabapentin (NEURONTIN) 400 MG capsule, Take 1 capsule (400 mg total) by mouth 2 (two) times daily in the morning and in the afternoon., Disp: 60 capsule, Rfl: 2   HYDROcodone-acetaminophen (NORCO/VICODIN) 5-325 MG tablet, Take 1 tablet by mouth every 6 (six) hours as needed., Disp: 15 tablet, Rfl: 0   ibuprofen (ADVIL) 600 MG tablet, Take 1 tablet (600 mg total) by mouth every 8 (eight) hours as needed for mild pain or  moderate pain., Disp: 30 tablet, Rfl: 0   ipratropium (ATROVENT) 0.03 % nasal spray, Place 2 sprays into both nostrils 3 (three) times daily., Disp: , Rfl:    Ipratropium-Albuterol (COMBIVENT RESPIMAT) 20-100 MCG/ACT AERS respimat, Inhale 2 puffs into the lungs every 6 (six) hours as needed., Disp: , Rfl:    Ipratropium-Albuterol (COMBIVENT RESPIMAT) 20-100 MCG/ACT AERS respimat, Inhale 2 puffs into the lungs every 6 (six) hours as needed for wheezing, Disp: 4 g, Rfl: 11   Ipratropium-Albuterol (COMBIVENT RESPIMAT) 20-100 MCG/ACT AERS respimat, INHALE 2 PUFFS INTO THE LUNGS EVERY 6 HOURS AS NEEDED, Disp: 4 g, Rfl: 11   lisinopril-hydrochlorothiazide (ZESTORETIC) 20-12.5 MG tablet, Take 2 tablets by mouth daily., Disp: 60 tablet, Rfl: 0   lisinopril-hydrochlorothiazide (ZESTORETIC) 20-12.5 MG tablet, Take two tablets by mouth daily., Disp: 180 tablet, Rfl: 1   lisinopril-hydrochlorothiazide (ZESTORETIC) 20-12.5 MG tablet, Take 2 tablets by mouth daily., Disp: 180 tablet, Rfl: 1   metFORMIN (GLUCOPHAGE-XR) 500 MG 24 hr tablet, Take 1,000 mg by mouth in the morning and at bedtime., Disp: , Rfl:    metFORMIN (GLUCOPHAGE-XR) 500 MG 24 hr tablet, Take 2 tablets (1,000 mg total) by mouth 2 (two) times daily., Disp: 360 tablet, Rfl: 1   montelukast (SINGULAIR) 10 MG tablet, Take 10 mg by mouth at bedtime., Disp: , Rfl:    montelukast (SINGULAIR) 10 MG tablet, Take 1 tablet (10 mg total) by mouth at bedtime., Disp: 90 tablet, Rfl: 1   moxifloxacin (VIGAMOX) 0.5 % ophthalmic solution, Place 1 drop into the right eye 3 (three) times daily. For 5 days, Disp: 3 mL, Rfl: 0   ondansetron (ZOFRAN-ODT) 4 MG disintegrating tablet, Take 1-2 tablets (4-8 mg total) by mouth every 8 (eight) hours as needed., Disp: 30 tablet, Rfl: 3   pantoprazole (PROTONIX) 40 MG tablet, Take 1 tablet (40 mg total) by mouth every morning., Disp: 90 tablet, Rfl: 1   predniSONE (STERAPRED UNI-PAK 21 TAB) 10 MG (21) TBPK tablet, Take by mouth  daily. Take 6 tabs by mouth daily  for 1 days, then 5 tabs for 1 days, then 4 tabs for 1 days, then 3 tabs for 1 days, 2 tabs for 1 days, then 1 tab by mouth daily for 1 days, Disp: 21 tablet, Rfl: 0   prochlorperazine (COMPAZINE) 10 MG tablet, Take 10 mg by mouth every 6 (six) hours as needed for nausea or vomiting., Disp: , Rfl:    prochlorperazine (COMPAZINE) 10 MG tablet, Take 0.5 tablets (5 mg total) by mouth every 6 (six) hours as needed for nausea or headache., Disp: 15 tablet, Rfl: 1   rizatriptan (MAXALT-MLT) 10 MG disintegrating tablet, Take 1 tablet (10 mg total) by mouth as needed for migraine. May repeat in 2 hours if needed (Patient not taking: Reported on 02/28/2022), Disp: 9 tablet, Rfl: 11   topiramate (TOPAMAX) 50 MG tablet, Start with one pill at bedtime(50mg ). In 2 weeks,  if no side effects, increase to 2 pills (100mg ) at bedtime (Patient not taking: Reported on 11/14/2022), Disp: 60 tablet, Rfl: 6   Medications ordered in this encounter:  No orders of the defined types were placed in this encounter.    *If you need refills on other medications prior to your next appointment, please contact your pharmacy*  Follow-Up: Call back or seek an in-person evaluation if the symptoms worsen or if the condition fails to improve as anticipated.  [facility] [phone]  Other Instructions Please continue your routine asthma medications, adding on the prednisone burst daily for 5 days. If this is not resolving, or any new/worsening symptoms please be evaluated in person.   Apply topical Silvadene cream once daily to burn for next 7-10 days. Please see additional care instructions below.   Burn Care, Adult A burn is an injury to the skin or the tissues under the skin. There are three types of burns: First degree. These burns may cause the skin to be red and a bit swollen. Second degree. These burns are very painful. They can make the skin very red, swell, leak fluid,  look shiny, and start to have blisters. Third degree. These burns cause lasting damage. They turn the skin white or black and make it look charred, dry, and leathery. Treatment will depend on the type of burn you have. Taking good care of your burn can help you get better faster. How to care for a first-degree burn Right after the burn: Rinse or soak the burn under cool water for 5 minutes or more. Put a cool, wet cloth on your burn. Do not put ice on your burn. Caring for the burn Your doctor may tell you to: Clean the burn using soap and water. Pat the burn dry using a clean cloth. Do not rub or scrub the burn. Put lotion or aloe vera gel on your burn. How to care for a second-degree burn Right after the burn: Rinse or soak the burn under cool water. Do this for 5-10 minutes. Do not put ice on your burn. Take off any jewelry or clothes near the burn. Cover the burn with a clean cloth. Caring for the burn Your doctor may tell you to: Clean or rinse your burn. Put a cream or ointment on the burn. Put a germ-free (sterile) dressing over the burn. A dressing is a bandage that can help a burn heal. Raise (elevate) the burned area above the level of your heart while you are sitting or lying down. How to care for a third-degree burn Right after the burn: Cover the burn with a clean, dry cloth. Get help right away. You may need to: Stay in the hospital. Have surgery to remove burned tissue. Have surgery to put new skin on the burned area. Get fluids through an IV tube. Caring for the burn Your doctor may tell you to: Clean or rinse out your burn. Put a cream or ointment on the burn. Put a germ-free bandage in the burn. This is called packing. Put a germ-free bandage over the burn. Raise the burned area above the level of your heart while you are sitting or lying down. Wear splints or immobilizers. Rest. Do not do sports or other activities until your doctor says that you can. How  to prevent infection when caring for a burn  Wash your hands with soap and water for at least 20 seconds before and after you take care of your burn.  If you cannot use soap and water, use hand sanitizer. Wear clean gloves as told by your doctor. Do not put butter, oil, toothpaste, or other home remedies on the burn. Do not scratch or pick at the burn. Do not break any blisters. Do not peel the skin. Do not rub your burn, even when cleaning it. Check your burn every day for signs of infection. Check for: More redness, swelling, or pain. Warmth. Pus or a bad smell. Red streaks around the burn. Follow these instructions at home Medicines Take over-the-counter and prescription medicines only as told by your doctor. If you were prescribed antibiotics, use them as told by your doctor. Do not stop using them even if you start to feel better. General instructions Do not smoke or use any products that contain nicotine or tobacco. If you need help quitting, ask your doctor. Drink enough fluid to keep your pee (urine) pale yellow. Protect your burn from the sun. Contact a doctor if: Your burn does not get better. Your burn gets worse. You have any signs of infection. Your burn looks different or gets black or red spots. Your pain does not get better with medicine. You feel worried, nervous, or sad (depressed). Get help right away if: You have red streaks near the burn. You have very bad pain. This information is not intended to replace advice given to you by your health care provider. Make sure you discuss any questions you have with your health care provider. Document Revised: 10/18/2022 Document Reviewed: 10/18/2022 Elsevier Patient Education  2024 Elsevier Inc.   If you have been instructed to have an in-person evaluation today at a local Urgent Care facility, please use the link below. It will take you to a list of all of our available [facility], including address, phone  number and hours of operation. Please do not delay care.  [facility]  If you or a family member do not have a primary care provider, use the link below to schedule a visit and establish care. When you choose a [facility] primary care physician or advanced practice provider, you gain a long-term partner in health. Find a Primary Care Provider  Learn more about [facility]'s in-office and virtual care options: [facility] - Get Care Now
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
## 2024-01-11 ENCOUNTER — Other Ambulatory Visit (HOSPITAL_COMMUNITY): Payer: Self-pay

## 2024-01-27 ENCOUNTER — Other Ambulatory Visit: Payer: Self-pay

## 2024-01-27 ENCOUNTER — Telehealth: Admitting: Physician Assistant

## 2024-01-27 DIAGNOSIS — J019 Acute sinusitis, unspecified: Secondary | ICD-10-CM | POA: Diagnosis not present

## 2024-01-27 DIAGNOSIS — B379 Candidiasis, unspecified: Secondary | ICD-10-CM

## 2024-01-27 DIAGNOSIS — T3695XA Adverse effect of unspecified systemic antibiotic, initial encounter: Secondary | ICD-10-CM | POA: Diagnosis not present

## 2024-01-27 DIAGNOSIS — B9689 Other specified bacterial agents as the cause of diseases classified elsewhere: Secondary | ICD-10-CM | POA: Diagnosis not present

## 2024-01-27 MED ORDER — FLUTICASONE-SALMETEROL 500-50 MCG/ACT IN AEPB
1.0000 | INHALATION_SPRAY | Freq: Two times a day (BID) | RESPIRATORY_TRACT | 11 refills | Status: AC
  Filled 2024-01-27: qty 60, 30d supply, fill #0
  Filled 2024-02-25: qty 60, 30d supply, fill #1
  Filled 2024-04-10: qty 60, 30d supply, fill #2
  Filled 2024-05-18: qty 60, 30d supply, fill #3
  Filled 2024-06-26: qty 60, 30d supply, fill #4
  Filled 2024-08-06: qty 60, 30d supply, fill #5
  Filled 2024-09-14: qty 60, 30d supply, fill #6
  Filled 2024-10-19: qty 60, 30d supply, fill #7

## 2024-01-27 MED ORDER — AMOXICILLIN-POT CLAVULANATE 875-125 MG PO TABS
1.0000 | ORAL_TABLET | Freq: Two times a day (BID) | ORAL | 0 refills | Status: DC
  Filled 2024-01-27: qty 14, 7d supply, fill #0

## 2024-01-27 MED ORDER — FLUCONAZOLE 150 MG PO TABS
150.0000 mg | ORAL_TABLET | ORAL | 0 refills | Status: DC | PRN
  Filled 2024-01-27: qty 2, 6d supply, fill #0

## 2024-01-27 NOTE — Progress Notes (Signed)

## 2024-02-17 ENCOUNTER — Ambulatory Visit (HOSPITAL_COMMUNITY)
Admission: EM | Admit: 2024-02-17 | Discharge: 2024-02-17 | Disposition: A | Discharge status: Home / Self Care | Attending: Emergency Medicine | Admitting: Emergency Medicine

## 2024-02-17 ENCOUNTER — Ambulatory Visit (INDEPENDENT_AMBULATORY_CARE_PROVIDER_SITE_OTHER)

## 2024-02-17 ENCOUNTER — Encounter (HOSPITAL_COMMUNITY): Payer: Self-pay

## 2024-02-17 DIAGNOSIS — M25561 Pain in right knee: Secondary | ICD-10-CM | POA: Diagnosis not present

## 2024-02-17 DIAGNOSIS — J302 Other seasonal allergic rhinitis: Secondary | ICD-10-CM | POA: Diagnosis not present

## 2024-02-17 DIAGNOSIS — S86812A Strain of other muscle(s) and tendon(s) at lower leg level, left leg, initial encounter: Secondary | ICD-10-CM

## 2024-02-17 DIAGNOSIS — M2241 Chondromalacia patellae, right knee: Secondary | ICD-10-CM

## 2024-02-17 MED ORDER — AZELASTINE HCL 0.1 % NA SOLN: 2.0000 | Freq: Two times a day (BID) | NASAL | 0 refills | Status: DC

## 2024-02-17 MED ORDER — NAPROXEN 500 MG PO TABS: 500.0000 mg | ORAL_TABLET | Freq: Two times a day (BID) | ORAL | 0 refills | Status: DC

## 2024-02-17 NOTE — ED Triage Notes (Signed)
 Pt c/o rt knee pain and swelling x1wk. States went on a 1.5hr plane ride on Thursday and diid lots of walking and now pain is worse. Took tylenol  and aleve with relief.  Pt c/o cough and congestion x3 days.

## 2024-02-17 NOTE — Discharge Instructions (Addendum)
 Take naproxen twice daily as needed for pain.  Do not take this with other NSAIDs including ibuprofen , Motrin , Advil , Aleve, and Goody powder. You can take 500 to 1000 mg Tylenol  at one time as needed for breakthrough pain.  Do not exceed 4000 mg in 1 day.  Alternate between ice and heat as needed for pain and swelling.  Rest and elevate as often as possible.  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 I recommend following up with [facility] for further evaluation of this.  Continue to take your Allegra for allergy symptoms. Use azelastine nasal spray twice daily to help with congestion. Follow-up with primary care or return here as needed.

## 2024-02-17 NOTE — ED Provider Notes (Signed)
 [facility]    CSN: [id number] Arrival date & time: 02/17/24  4098      History   Chief Complaint Chief Complaint  Patient presents with   Knee Pain   Cough    HPI Yvette Perry is a 49 y.o. female.   Patient presents with right knee pain and swelling x 1 week.  Patient states that she recently traveled to [place]  via plane and did a lot of walking while traveling and states the pain and swelling in her right knee became worse.  Patient describes a pulling sensation to the back of the knee as well as her calf muscle and reports noticing swelling surrounding her knee.  Denies increased swelling to lower leg, discoloration to the lower leg and knee, and difficulty bearing weight.  Denies any recent falls or known injury.  Patient reports taking Tylenol  and Aleve with relief.  Patient also reports congestion and mild cough x 3 days.  Patient states that she has seasonal allergies and they have gotten a little worse since returning from [place] .  Patient states that she takes Allegra with some relief, but the congestion is still bothersome.  Denies shortness of breath, chest pain, fever, sore throat, and headache.  The history is provided by the patient and medical records.  Knee Pain Cough   Past Medical History:  Diagnosis Date   Abnormal Pap smear of cervix    LGSIL   Asthma    Diabetes mellitus (HCC)    Goiter    Hypertension    Migraine    Pre-diabetes    Uterine fibroid     Patient Active Problem List   Diagnosis Date Noted   Abnormal uterine bleeding (AUB) 09/18/2022   Abscess of chest wall 02/01/2022   Cellulitis 01/31/2022   Migraine    Diabetes mellitus (HCC)    Asthma    Hypertension    Obesity, Class III, BMI 40-49.9 (morbid obesity)    Hypokalemia    Normocytic anemia    Migraine without aura and without status migrainosus, not intractable 04/04/2021    Past Surgical History:  Procedure Laterality Date   CESAREAN SECTION  06/2003    COLPOSCOPY  12/22/2008   normal   DILITATION & CURRETTAGE/HYSTROSCOPY WITH NOVASURE ABLATION N/A 09/18/2022   Procedure: DILATATION & CURETTAGE/HYSTEROSCOPY WITH NOVASURE ABLATION;  Surgeon: Lenord Radon, MD;  Location: [facility];  Service: Gynecology;  Laterality: N/A;   ENDOMETRIAL BIOPSY  06/30/2013   benign   INCISION AND DRAINAGE ABSCESS Right 02/02/2022   Procedure: INCISION AND DRAINAGE ABSCESS RIGHT CHEST WALL;  Surgeon: Lillette Reid III, MD;  Location: [facility];  Service: General;  Laterality: Right;  LATERAL/PT ATE AT 0930   TUBAL LIGATION  06/2003    OB History     Gravida  4   Para  4   Term  3   Preterm  1   AB      Living  4      SAB      IAB      Ectopic      Multiple  1   Live Births  4            Home Medications    Prior to Admission medications   Medication Sig Start Date End Date Taking? Authorizing Provider  azelastine (ASTELIN) 0.1 % nasal spray Place 2 sprays into both nostrils 2 (two) times daily. Use in each nostril as directed 02/17/24  Yes Rosevelt Constable, Carrier  A, NP  naproxen (NAPROSYN) 500 MG tablet Take 1 tablet (500 mg total) by mouth 2 (two) times daily. 02/17/24  Yes Rosevelt Constable, Solomia Harrell A, NP  albuterol  (PROVENTIL  HFA;VENTOLIN  HFA) 108 (90 BASE) MCG/ACT inhaler Inhale 2 puffs into the lungs every 6 (six) hours as needed. For shortness of breath  Patient not taking: Reported on 01/31/2022    [provider]  ascorbic acid (VITAMIN C) 500 MG tablet Take 500 mg by mouth 3 (three) times daily. With rose hips    [provider]  B Complex-C-Biotin-D-Zinc-FA (VITAL-D RX PO) Take 1,000 mg by mouth daily.    [provider]  butalbital -acetaminophen -caffeine  (FIORICET  WITH CODEINE) 50-325-40-30 MG capsule Take 1 capsule by mouth every 4 (four) hours as needed for headache.    [provider]  cholecalciferol  (VITAMIN D3) 25 MCG (1000 UNIT) tablet Take 1 tablet (1,000 Units total) by mouth  daily. 10/17/23     cholecalciferol  (VITAMIN D3) 25 MCG (1000 UT) tablet Take 1,000 Units by mouth daily.    [provider]  EPINEPHrine  (EPIPEN  2-PAK) 0.3 mg/0.3 mL IJ SOAJ injection Inject 0.3 mLs (0.3 mg total) into the muscle once as needed (allergic reaction). Patient taking differently: Inject 0.3 mg into the muscle once as needed for anaphylaxis (allergic reaction). 05/12/16   Aisha Hove, MD  ferrous sulfate  325 (65 FE) MG tablet Take 325 mg by mouth 3 (three) times daily with meals.    [provider]  fexofenadine (ALLEGRA) 180 MG tablet Take 180 mg by mouth daily.    [provider]  fluticasone  (FLONASE ) 50 MCG/ACT nasal spray Place 1 spray into the nose daily.     [provider]  fluticasone  (FLONASE ) 50 MCG/ACT nasal spray Shake liquid and Place 2 sprays into both nostrils 2 (two) times daily. 01/09/23     fluticasone  (FLONASE ) 50 MCG/ACT nasal spray Shake liquid and Place 2 sprays into both nostrils 2 (two) times daily. 10/17/23     fluticasone -salmeterol (ADVAIR ) 500-50 MCG/ACT AEPB Inhale 1 puff into the lungs in the morning and at bedtime.    [provider]  fluticasone -salmeterol (ADVAIR ) 500-50 MCG/ACT AEPB Inhale 1 puff into the lungs 2 (two) times daily. 01/27/24     gabapentin  (NEURONTIN ) 300 MG capsule Take 300 mg by mouth as needed (moderate pain). 01/18/22   [provider]  gabapentin  (NEURONTIN ) 300 MG capsule Take 2 capsules (600 mg total) by mouth at bedtime. 02/13/23     gabapentin  (NEURONTIN ) 300 MG capsule Take 2 capsules (600 mg total) by mouth at bedtime. 10/17/23     gabapentin  (NEURONTIN ) 400 MG capsule Take 1 capsule (400 mg total) by mouth 2 (two) times daily in the morning and in the afternoon. 12/17/23     ibuprofen  (ADVIL ) 600 MG tablet Take 1 tablet (600 mg total) by mouth every 8 (eight) hours as needed for mild pain or moderate pain. 07/25/23   Summers, Rhonda L, PA-C  ipratropium (ATROVENT ) 0.03 % nasal spray  Place 2 sprays into both nostrils 3 (three) times daily.    [provider]  Ipratropium-Albuterol  (COMBIVENT  RESPIMAT) 20-100 MCG/ACT AERS respimat Inhale 2 puffs into the lungs every 6 (six) hours as needed. 11/26/17   [provider]  Ipratropium-Albuterol  (COMBIVENT  RESPIMAT) 20-100 MCG/ACT AERS respimat Inhale 2 puffs into the lungs every 6 (six) hours as needed for wheezing 02/20/23     Ipratropium-Albuterol  (COMBIVENT  RESPIMAT) 20-100 MCG/ACT AERS respimat INHALE 2 PUFFS INTO THE LUNGS EVERY 6 HOURS AS  NEEDED 10/17/23     lisinopril -hydrochlorothiazide  (ZESTORETIC ) 20-12.5 MG tablet Take 2 tablets by mouth daily. 02/05/22 09/18/22  Lesa Rape, MD  lisinopril -hydrochlorothiazide  (ZESTORETIC ) 20-12.5 MG tablet Take two tablets by mouth daily. 02/12/23     lisinopril -hydrochlorothiazide  (ZESTORETIC ) 20-12.5 MG tablet Take 2 tablets by mouth daily. 08/28/23     metFORMIN  (GLUCOPHAGE -XR) 500 MG 24 hr tablet Take 1,000 mg by mouth in the morning and at bedtime.    [provider]  metFORMIN  (GLUCOPHAGE -XR) 500 MG 24 hr tablet Take 2 tablets (1,000 mg total) by mouth 2 (two) times daily. 10/17/23     montelukast  (SINGULAIR ) 10 MG tablet Take 10 mg by mouth at bedtime.    [provider]  montelukast  (SINGULAIR ) 10 MG tablet Take 1 tablet (10 mg total) by mouth at bedtime. 08/28/23     ondansetron  (ZOFRAN -ODT) 4 MG disintegrating tablet Take 1-2 tablets (4-8 mg total) by mouth every 8 (eight) hours as needed. 04/04/21   Glory Larsen, MD  pantoprazole  (PROTONIX ) 40 MG tablet Take 1 tablet (40 mg total) by mouth every morning. 08/28/23     prochlorperazine  (COMPAZINE ) 10 MG tablet Take 10 mg by mouth every 6 (six) hours as needed for nausea or vomiting.    [provider]  prochlorperazine  (COMPAZINE ) 10 MG tablet Take 0.5 tablets (5 mg total) by mouth every 6 (six) hours as needed for nausea or headache. 10/17/23     rizatriptan  (MAXALT -MLT) 10 MG disintegrating  tablet Take 1 tablet (10 mg total) by mouth as needed for migraine. May repeat in 2 hours if needed 04/04/21   Glory Larsen, MD  silver  sulfADIAZINE  (SILVADENE ) 1 % cream Apply 1 Application topically daily. 01/01/24   Farris Hong, PA-C  topiramate  (TOPAMAX ) 50 MG tablet Start with one pill at bedtime(50mg ). In 2 weeks, if no side effects, increase to 2 pills (100mg ) at bedtime Patient not taking: Reported on 11/14/2022 04/04/21   Glory Larsen, MD    Family History Family History  Problem Relation Age of Onset   Hypertension Father    Diabetes Father    Kidney failure Father    Hypertension Mother     Social History Social History   Tobacco Use   Smoking status: Never   Smokeless tobacco: Never  Vaping Use   Vaping status: Never Used  Substance Use Topics   Alcohol use: Not Currently    Comment: rarely   Drug use: No     Allergies   Patient has no known allergies.   Review of Systems Review of Systems  Respiratory:  Positive for cough.    Per HPI  Physical Exam Triage Vital Signs ED Triage Vitals [02/17/24 0921]  Encounter Vitals Group     BP (!) 145/81     Systolic BP Percentile      Diastolic BP Percentile      Pulse Rate 77     Resp 18     Temp 97.7 F (36.5 C)     Temp Source Oral     SpO2 95 %     Weight      Height      Head Circumference      Peak Flow      Pain Score 6     Pain Loc      Pain Education      Exclude from Growth Chart    No data found.  Updated Vital Signs BP (!) 145/81 (BP Location: Left Arm)  Pulse 77   Temp 97.7 F (36.5 C) (Oral)   Resp 18   SpO2 95%   Visual Acuity Right Eye Distance:   Left Eye Distance:   Bilateral Distance:    Right Eye Near:   Left Eye Near:    Bilateral Near:     Physical Exam Vitals and nursing note reviewed.  Constitutional:      General: She is awake. She is not in acute distress.    Appearance: Normal appearance. She is well-developed and well-groomed. She is not  ill-appearing.  HENT:     Right Ear: Tympanic membrane, ear canal and external ear normal.     Left Ear: Tympanic membrane, ear canal and external ear normal.     Nose: Congestion and rhinorrhea present.     Mouth/Throat:     Mouth: Mucous membranes are moist.     Pharynx: Oropharynx is clear. Postnasal drip present.  Cardiovascular:     Rate and Rhythm: Normal rate and regular rhythm.  Pulmonary:     Effort: Pulmonary effort is normal.     Breath sounds: Normal breath sounds.  Musculoskeletal:     Right knee: Swelling present. No deformity or erythema. Normal range of motion. No patellar tendon tenderness.     Right lower leg: Tenderness present. No swelling, deformity, lacerations or bony tenderness.  Skin:    General: Skin is warm and dry.  Neurological:     Mental Status: She is alert.  Psychiatric:        Behavior: Behavior is cooperative.      UC Treatments / Results  Labs (all labs ordered are listed, but only abnormal results are displayed) Labs Reviewed - No data to display  EKG   Radiology DG Knee Complete 4 Views Right Result Date: 02/17/2024 CLINICAL DATA:  Knee pain and swelling EXAM: RIGHT KNEE - COMPLETE 4+ VIEW COMPARISON:  None Available. FINDINGS: No obvious fractures. 7 mm round well corticated cystic structure in the patella correlate with patellar chondromalacia IMPRESSION: *No obvious fractures. *7 mm round well corticated cystic structure in the patella correlate with patellar chondromalacia. Patellar chondromalacia *No fracture or other bony abnormalities Electronically Signed   By: Fredrich Jefferson M.D.   On: 02/17/2024 10:33    Procedures Procedures (including critical care time)  Medications Ordered in UC Medications - No data to display  Initial Impression / Assessment and Plan / UC Course  I have reviewed the triage vital signs and the nursing notes.  Pertinent labs & imaging results that were available during my care of the patient were  reviewed by me and considered in my medical decision making (see chart for details).     Patient is well-appearing.  Vitals are stable.  Mild congestion and rhinorrhea present, PND noted to pharynx.  Nontender upon palpation to right knee and surrounding structures, there is some mild swelling noted to soft tissue surrounding knee.  Endorses some tenderness to upper aspect of posterior lower leg above calf muscle.  X-ray ordered.  Based on my interpretation there is no obvious fracture or dislocation noted.  Radiology report reveals a 7 mm round well-corticated cystic structure in the patella suspicious for patellar chondromalacia.  Prescribe naproxen as needed for pain and inflammation.  Recommended taking Tylenol  as needed for breakthrough pain.  Discussed alternating between ice and heat rest and elevation as often as possible.  Given orthopedic follow-up.  Prescribed azelastine as needed for congestion.  Recommended continuing Allegra daily.  Discussed follow-up and return  precautions. Final Clinical Impressions(s) / UC Diagnoses   Final diagnoses:  Acute pain of right knee  Seasonal allergies  Strain of calf muscle, left, initial encounter  Chondromalacia of right patella     Discharge Instructions      Take naproxen twice daily as needed for pain.  Do not take this with other NSAIDs including ibuprofen , Motrin , Advil , Aleve, and Goody powder. You can take 500 to 1000 mg Tylenol  at one time as needed for breakthrough pain.  Do not exceed 4000 mg in 1 day.  Alternate between ice and heat as needed for pain and swelling.  Rest and elevate as often as possible.  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 I recommend following up with [facility] for further evaluation of this.  Continue to take your Allegra for allergy symptoms. Use azelastine nasal spray twice daily to help with  congestion. Follow-up with primary care or return here as needed.     ED Prescriptions     Medication Sig Dispense Auth. Provider   azelastine (ASTELIN) 0.1 % nasal spray Place 2 sprays into both nostrils 2 (two) times daily. Use in each nostril as directed 30 mL Levora Reas A, NP   naproxen (NAPROSYN) 500 MG tablet Take 1 tablet (500 mg total) by mouth 2 (two) times daily. 30 tablet Levora Reas A, NP      PDMP not reviewed this encounter.   Levora Reas A, NP 02/17/24 1051

## 2024-02-25 ENCOUNTER — Other Ambulatory Visit: Payer: Self-pay

## 2024-02-26 ENCOUNTER — Other Ambulatory Visit: Payer: Self-pay

## 2024-02-26 MED ORDER — COMBIVENT RESPIMAT 20-100 MCG/ACT IN AERS
2.0000 | INHALATION_SPRAY | Freq: Four times a day (QID) | RESPIRATORY_TRACT | 11 refills | Status: AC | PRN
  Filled 2024-02-26 – 2024-04-10 (×2): qty 4, 15d supply, fill #0
  Filled 2024-05-18: qty 4, 15d supply, fill #1
  Filled 2024-08-06: qty 4, 15d supply, fill #2
  Filled 2024-09-14: qty 4, 15d supply, fill #3
  Filled 2024-10-19: qty 4, 15d supply, fill #4

## 2024-02-27 ENCOUNTER — Other Ambulatory Visit: Payer: Self-pay

## 2024-02-27 ENCOUNTER — Ambulatory Visit (INDEPENDENT_AMBULATORY_CARE_PROVIDER_SITE_OTHER): Admitting: Family Medicine

## 2024-02-27 ENCOUNTER — Encounter: Payer: Self-pay | Admitting: Family Medicine

## 2024-02-27 VITALS — BP 147/94 | Ht 67.0 in | Wt 298.0 lb

## 2024-02-27 DIAGNOSIS — M25561 Pain in right knee: Secondary | ICD-10-CM | POA: Diagnosis not present

## 2024-02-27 DIAGNOSIS — M25461 Effusion, right knee: Secondary | ICD-10-CM | POA: Diagnosis not present

## 2024-02-27 NOTE — Progress Notes (Unsigned)
 PCP: Crawford Dock, MD  SUBJECTIVE:   HPI:  Patient is a 48 y.o. female here with chief complaint of acute right knee pain.   She notes 2 weeks of right knee pain. No known injury. She was seen at urgent care for this on 5/5 and had x-rays of the knee. The thought at that visit was that she had strained her calf and she was sent Naproxen  and recommended to also be taking tylenol . She doesn't feel like these have made a big difference for her.  Today she is endorsing medial knee pain and tightness down the medial calf. Pain worse with weight bearing. She has started to use a cane due to the pain and difficulty ambulating. She does endorse instability and buckling of the right knee as well as catching and locking. She tried to ACE wrap it and hasn't found this to be helpful. Does report roughly 2-3 weeks ago she had her right knee give out and was seen at orthopedist in [facility] nd this was treated with rest, NSAIDS and gradually ahd improve. Denies other knee injuries or surgeries.  Pertinent ROS were reviewed with the patient and found to be negative unless otherwise specified above in HPI.   PERTINENT  PMH / PSH / FH / SH:  Past Medical, Surgical, Social, and Family History Reviewed & Updated in the EMR.  Pertinent findings include:  Diabetes Morbid Obesity  No Known Allergies  OBJECTIVE:  BP (!) 147/94   Ht 5\' 7"  (1.702 m)   Wt 298 lb (135.2 kg)   BMI 46.67 kg/m   PHYSICAL EXAM:  GEN: Alert and Oriented, NAD, comfortable in exam room RESP: Unlabored respirations, symmetric chest rise PSY: normal mood, congruent affect   RIGHT KNEE MSK EXAM: Exam limited by body habitus. Mild effusion. No gross deformity, ecchymoses. TTP medial knee joint, non-tender lateral joint. TTP medial gastroc tendon and muscle belly. ROM 0-100d with normal strength. Negative ant/post drawers. Negative valgus/varus testing.  Positive McMurrays and Thessaly. NV intact distally.  Right Knee X-rays  from 02/17/24 were independently reviewed and showed fairly well preserved joint lines. Small subchondral cyst noted in patella reflective of patellar chondromalacia. No acute fractures or bony abnormalities.   Limited MSK Ultrasound of the Right Knee Mild effusion in the suprapatellar pouch Patellar and quadriceps tendons are normal Medial meniscus unable to clearly visualize, joint line is mildly narrowed with swelling noted but no appreciable spurring  Posterior knee visualized and no apparent cystic collection. Vessels visualized and patent.   Impression: Mild knee effusion, exam limited by body habitus. U/S performed and interpreted by Lin Rend, MD.  Assessment & Plan  48 year old female with acute right knee pain with positive mechanical symptoms, effusion, and instability. Concern for meniscus tear, ligaments feel intact on exam. Limited MSK U/S (limited by body habitus) confirm presence of effusion and no obvious baker's cyst, however do not provide a good view of her menisci to evaluate for possible tear.  1. Acute pain of right knee (Primary) 2. Effusion of right knee joint 3. Morbid Obesity  - MRI of the right knee ordered to evaluate for suspected meniscus tear possibly warranting arthroscopic surgical intervention. - Supportive care reviewed with recommendation for relative rest, icing, continuation of Naproxen  500mg  BID, and scheduled Tylenol  1g TID - Follow-up after MRI. If no evidence of meniscal tear warranting surgical evaluation consider custom bracing given body habitus.   Lin Rend, MD PGY-4, Sports Medicine Fellow [facility]

## 2024-02-27 NOTE — Patient Instructions (Signed)
 You were seen today for right knee pain.  I am suspicious of a torn medial meniscus based on your exam and mechanical symptoms. I recommend relative rest on this knee and continuation of icing, tylenol  1000mg  TID, and naproxen  500mg  BID We have ordered an MRI of the knee to further evaluate for a tear. We will follow-up after the MRI to discuss next steps.

## 2024-02-28 ENCOUNTER — Encounter: Payer: Self-pay | Admitting: Family Medicine

## 2024-02-29 ENCOUNTER — Ambulatory Visit
Admission: RE | Admit: 2024-02-29 | Discharge: 2024-02-29 | Disposition: A | Discharge status: Home / Self Care | Source: Ambulatory Visit | Attending: Family Medicine | Admitting: Family Medicine

## 2024-02-29 DIAGNOSIS — M25561 Pain in right knee: Secondary | ICD-10-CM

## 2024-03-02 ENCOUNTER — Other Ambulatory Visit: Payer: Self-pay

## 2024-03-02 ENCOUNTER — Ambulatory Visit: Payer: Self-pay | Admitting: Family Medicine

## 2024-03-02 DIAGNOSIS — S83241A Other tear of medial meniscus, current injury, right knee, initial encounter: Secondary | ICD-10-CM

## 2024-03-02 DIAGNOSIS — M1711 Unilateral primary osteoarthritis, right knee: Secondary | ICD-10-CM

## 2024-03-11 DIAGNOSIS — M1711 Unilateral primary osteoarthritis, right knee: Secondary | ICD-10-CM | POA: Diagnosis not present

## 2024-03-25 ENCOUNTER — Other Ambulatory Visit: Payer: Self-pay

## 2024-03-26 ENCOUNTER — Other Ambulatory Visit: Payer: Self-pay

## 2024-03-27 ENCOUNTER — Other Ambulatory Visit: Payer: Self-pay

## 2024-03-28 ENCOUNTER — Other Ambulatory Visit: Payer: Self-pay

## 2024-03-30 ENCOUNTER — Other Ambulatory Visit: Payer: Self-pay

## 2024-03-30 MED ORDER — MONTELUKAST SODIUM 10 MG PO TABS
10.0000 mg | ORAL_TABLET | Freq: Every day | ORAL | 1 refills | Status: DC
  Filled 2024-03-30: qty 90, 90d supply, fill #0
  Filled 2024-06-26: qty 90, 90d supply, fill #1

## 2024-03-30 MED ORDER — LISINOPRIL-HYDROCHLOROTHIAZIDE 20-12.5 MG PO TABS
2.0000 | ORAL_TABLET | Freq: Every day | ORAL | 1 refills | Status: AC
  Filled 2024-03-30: qty 180, 90d supply, fill #0
  Filled 2024-09-14: qty 180, 90d supply, fill #1

## 2024-03-30 MED ORDER — PANTOPRAZOLE SODIUM 40 MG PO TBEC
40.0000 mg | DELAYED_RELEASE_TABLET | Freq: Every morning | ORAL | 1 refills | Status: DC
  Filled 2024-03-30: qty 90, 90d supply, fill #0
  Filled 2024-05-18 – 2024-06-26 (×2): qty 90, 90d supply, fill #1

## 2024-04-09 ENCOUNTER — Ambulatory Visit: Admitting: Family Medicine

## 2024-04-10 ENCOUNTER — Other Ambulatory Visit: Payer: Self-pay

## 2024-04-29 ENCOUNTER — Telehealth: Admitting: Physician Assistant

## 2024-04-29 DIAGNOSIS — B9689 Other specified bacterial agents as the cause of diseases classified elsewhere: Secondary | ICD-10-CM | POA: Diagnosis not present

## 2024-04-29 DIAGNOSIS — J019 Acute sinusitis, unspecified: Secondary | ICD-10-CM

## 2024-04-29 MED ORDER — AMOXICILLIN-POT CLAVULANATE 875-125 MG PO TABS: 1.0000 | ORAL_TABLET | Freq: Two times a day (BID) | ORAL | 0 refills | Status: DC

## 2024-04-29 NOTE — Progress Notes (Signed)

## 2024-05-08 DIAGNOSIS — E119 Type 2 diabetes mellitus without complications: Secondary | ICD-10-CM | POA: Diagnosis not present

## 2024-05-18 ENCOUNTER — Other Ambulatory Visit: Payer: Self-pay

## 2024-05-18 MED ORDER — GABAPENTIN 300 MG PO CAPS
600.0000 mg | ORAL_CAPSULE | Freq: Every day | ORAL | 2 refills | Status: AC
  Filled 2024-05-18: qty 60, 30d supply, fill #0

## 2024-05-18 MED ORDER — PANTOPRAZOLE SODIUM 40 MG PO TBEC
40.0000 mg | DELAYED_RELEASE_TABLET | Freq: Every morning | ORAL | 1 refills | Status: AC
  Filled 2024-10-19 (×3): qty 90, 90d supply, fill #0

## 2024-05-20 ENCOUNTER — Ambulatory Visit: Admitting: Orthopedic Surgery

## 2024-05-20 ENCOUNTER — Telehealth: Payer: Self-pay

## 2024-05-20 DIAGNOSIS — M1711 Unilateral primary osteoarthritis, right knee: Secondary | ICD-10-CM

## 2024-05-20 MED ORDER — MELOXICAM 15 MG PO TABS: ORAL_TABLET | ORAL | 0 refills | Status: DC

## 2024-05-20 NOTE — Progress Notes (Unsigned)
 Office Visit Note   Patient: Yvette Perry           Date of Birth: March 24, 1976           MRN: [id number] Visit Date: 05/20/2024 Requested by: Petrina Coy, MD [address] [place],  [place] [address] PCP: Petrina Coy, MD  Subjective: Chief Complaint  Patient presents with   Right Knee - Pain    HPI: Yvette Perry is a 48 y.o. female who presents to the office reporting right knee pain.  Been ongoing since May.  Acute onset of pain but no definite injury.  Stairs are difficult.  Has had 1 injection anterior approach.  Knee generally feels stiff and gives out.  Tylenol  and anti-inflammatories helped some.  Pain does wake her from sleep at night.  Patient has had an MRI scan which shows full-thickness cartilage loss in the patellofemoral compartment as well as tear at the root of the medial meniscus with slight medial displacement of the meniscus.  Patient does have significantly increased BMI..                ROS: All systems reviewed are negative as they relate to the chief complaint within the history of present illness.  Patient denies fevers or chills.  Assessment & Plan: Visit Diagnoses:  1. Arthritis of right knee     Plan: Impression is right knee pain with meniscal pathology.  Patient also has full-thickness chondral defect.  Chondral defect of the medial femoral condyle is also present.  I think it is 50-50 she gets any relief from arthroscopy.  We will see how she does with the gel injections.  Follow-up in 3 to 4 weeks for those shots.  Follow-Up Instructions: No follow-ups on file.   Orders:  No orders of the defined types were placed in this encounter.  Meds ordered this encounter  Medications   meloxicam  (MOBIC ) 15 MG tablet    Sig: 1 po q d    Dispense:  30 tablet    Refill:  0      Procedures: No procedures performed   Clinical Data: No additional findings.  Objective: Vital Signs: There were no vitals taken for this  visit.  Physical Exam:  Constitutional: Patient appears well-developed HEENT:  Head: Normocephalic Eyes:EOM are normal Neck: Normal range of motion Cardiovascular: Normal rate Pulmonary/chest: Effort normal Neurologic: Patient is alert Skin: Skin is warm Psychiatric: Patient has normal mood and affect  Ortho Exam: Ortho exam demonstrates no effusion and good range of motion.  Medial greater than lateral joint line tenderness.  Collateral cruciate ligaments are stable.  Patellofemoral crepitus is present.  No groin pain with internal/external rotation of the leg.  Specialty Comments:  No specialty comments available.  Imaging: No results found.   PMFS History: Patient Active Problem List   Diagnosis Date Noted   Primary osteoarthritis of right knee 03/02/2024   Abnormal uterine bleeding (AUB) 09/18/2022   Abscess of chest wall 02/01/2022   Cellulitis 01/31/2022   Migraine    Diabetes mellitus (HCC)    Asthma    Hypertension    Obesity, Class III, BMI 40-49.9 (morbid obesity)    Hypokalemia    Normocytic anemia    Migraine without aura and without status migrainosus, not intractable 04/04/2021   Past Medical History:  Diagnosis Date   Abnormal Pap smear of cervix    LGSIL   Asthma    Diabetes mellitus (HCC)    Goiter  Hypertension    Migraine    Pre-diabetes    Uterine fibroid     Family History  Problem Relation Age of Onset   Hypertension Father    Diabetes Father    Kidney failure Father    Hypertension Mother     Past Surgical History:  Procedure Laterality Date   CESAREAN SECTION  06/2003   COLPOSCOPY  12/22/2008   normal   DILITATION & CURRETTAGE/HYSTROSCOPY WITH NOVASURE ABLATION N/A 09/18/2022   Procedure: DILATATION & CURETTAGE/HYSTEROSCOPY WITH NOVASURE ABLATION;  Surgeon: Corene Coy, MD;  Location: [facility];  Service: Gynecology;  Laterality: N/A;   ENDOMETRIAL BIOPSY  06/30/2013   benign   INCISION AND  DRAINAGE ABSCESS Right 02/02/2022   Procedure: INCISION AND DRAINAGE ABSCESS RIGHT CHEST WALL;  Surgeon: Curvin Deward MOULD, MD;  Location: [facility];  Service: General;  Laterality: Right;  LATERAL/PT ATE AT 0930   TUBAL LIGATION  06/2003   Social History   Occupational History   Not on file  Tobacco Use   Smoking status: Never   Smokeless tobacco: Never  Vaping Use   Vaping status: Never Used  Substance and Sexual Activity   Alcohol use: Not Currently    Comment: rarely   Drug use: No   Sexual activity: Not on file    Comment: tubal ligation

## 2024-05-20 NOTE — Telephone Encounter (Signed)
Auth needed for right knee gel  

## 2024-05-22 ENCOUNTER — Encounter: Payer: Self-pay | Admitting: Orthopedic Surgery

## 2024-05-27 NOTE — Telephone Encounter (Signed)
 VOB submitted for Monovisc, right knee

## 2024-05-28 ENCOUNTER — Other Ambulatory Visit: Payer: Self-pay

## 2024-05-28 DIAGNOSIS — Z111 Encounter for screening for respiratory tuberculosis: Secondary | ICD-10-CM | POA: Diagnosis not present

## 2024-05-28 DIAGNOSIS — Z1331 Encounter for screening for depression: Secondary | ICD-10-CM | POA: Diagnosis not present

## 2024-05-28 MED ORDER — EPINEPHRINE 0.3 MG/0.3ML IJ SOAJ
0.3000 mg | Freq: Once | INTRAMUSCULAR | 0 refills | Status: AC
  Filled 2024-05-28: qty 0.6, 2d supply, fill #0

## 2024-06-08 ENCOUNTER — Other Ambulatory Visit: Payer: Self-pay

## 2024-06-15 DIAGNOSIS — J069 Acute upper respiratory infection, unspecified: Secondary | ICD-10-CM | POA: Diagnosis not present

## 2024-06-17 ENCOUNTER — Other Ambulatory Visit: Payer: Self-pay | Admitting: Orthopedic Surgery

## 2024-06-26 ENCOUNTER — Other Ambulatory Visit: Payer: Self-pay

## 2024-06-26 MED ORDER — GABAPENTIN 400 MG PO CAPS
400.0000 mg | ORAL_CAPSULE | Freq: Two times a day (BID) | ORAL | 2 refills | Status: AC
  Filled 2024-06-26: qty 60, 30d supply, fill #0

## 2024-07-01 ENCOUNTER — Other Ambulatory Visit: Payer: Self-pay

## 2024-07-01 MED ORDER — LIDOCAINE 5 % EX PTCH
1.0000 | MEDICATED_PATCH | Freq: Every day | CUTANEOUS | 3 refills | Status: AC
  Filled 2024-07-01 – 2024-08-06 (×3): qty 30, 30d supply, fill #0
  Filled 2024-09-14: qty 30, 30d supply, fill #1
  Filled 2024-10-19: qty 30, 30d supply, fill #2

## 2024-07-01 MED ORDER — GABAPENTIN 400 MG PO CAPS
400.0000 mg | ORAL_CAPSULE | Freq: Three times a day (TID) | ORAL | 2 refills | Status: AC
  Filled 2024-07-01 – 2024-08-06 (×2): qty 90, 30d supply, fill #0
  Filled 2024-09-14: qty 90, 30d supply, fill #1
  Filled 2024-10-19: qty 90, 30d supply, fill #2

## 2024-07-01 MED ORDER — TIZANIDINE HCL 2 MG PO TABS
2.0000 mg | ORAL_TABLET | Freq: Three times a day (TID) | ORAL | 0 refills | Status: AC | PRN
  Filled 2024-07-01: qty 90, 30d supply, fill #0

## 2024-07-02 ENCOUNTER — Other Ambulatory Visit: Payer: Self-pay

## 2024-07-04 ENCOUNTER — Other Ambulatory Visit (HOSPITAL_COMMUNITY): Payer: Self-pay

## 2024-07-13 ENCOUNTER — Other Ambulatory Visit: Payer: Self-pay

## 2024-08-06 ENCOUNTER — Other Ambulatory Visit: Payer: Self-pay

## 2024-08-07 ENCOUNTER — Other Ambulatory Visit: Payer: Self-pay

## 2024-08-07 ENCOUNTER — Telehealth: Admitting: Family Medicine

## 2024-08-07 DIAGNOSIS — J45901 Unspecified asthma with (acute) exacerbation: Secondary | ICD-10-CM

## 2024-08-07 DIAGNOSIS — J069 Acute upper respiratory infection, unspecified: Secondary | ICD-10-CM

## 2024-08-07 MED ORDER — PREDNISONE 10 MG PO TABS
ORAL_TABLET | ORAL | 0 refills | Status: AC
  Filled 2024-08-07: qty 42, 12d supply, fill #0

## 2024-08-07 NOTE — Patient Instructions (Signed)
 Asthma, Adult  Asthma is a long-term (chronic) condition that causes recurrent episodes in which the lower airways in the lungs become tight and narrow. The narrowing is caused by inflammation and tightening of the smooth muscle around the lower airways. Asthma episodes, also called asthma attacks or asthma flares, may cause coughing, making high-pitched whistling sounds when you breathe, most often when you breathe out (wheezing), shortness of breath, and chest pain. The airways may produce extra mucus caused by the inflammation and irritation. During an attack, it can be difficult to breathe. Asthma attacks can range from minor to life-threatening. Asthma cannot be cured, but medicines and lifestyle changes can help control it and treat acute attacks. It is important to keep your asthma well controlled so the condition does not interfere with your daily life. What are the causes? This condition is believed to be caused by inherited (genetic) and environmental factors, but its exact cause is not known. What can trigger an asthma attack? Many things can bring on an asthma attack or make symptoms worse. These triggers are different for every person. Common triggers include: Allergens and irritants like mold, dust, pet dander, cockroaches, pollen, air pollution, and chemical odors. Cigarette smoke. Weather changes and cold air. Stress and strong emotional responses such as crying or laughing hard. Certain medications such as aspirin or beta blockers. Infections and inflammatory conditions, such as the flu, a cold, pneumonia, or inflammation of the nasal membranes (rhinitis). Gastroesophageal reflux disease (GERD). What are the signs or symptoms? Symptoms may occur right after exposure to an asthma trigger or hours later and can vary by person. Common signs and symptoms include: Wheezing. Trouble breathing (shortness of breath). Excessive nighttime or early morning coughing. Chest  tightness. Tiredness (fatigue) with minimal activity. Difficulty talking in complete sentences. Poor exercise tolerance. How is this diagnosed? This condition is diagnosed based on: A physical exam and your medical history. Tests, which may include: Lung function studies to evaluate the flow of air in your lungs. Allergy tests. Imaging tests, such as X-rays. How is this treated? There is no cure, but symptoms can be controlled with proper treatment. Treatment usually involves: Identifying and avoiding your asthma triggers. Inhaled medicines. Two types are commonly used to treat asthma, depending on severity: Controller medicines. These help prevent asthma symptoms from occurring. They are taken every day. Fast-acting reliever or rescue medicines. These quickly relieve asthma symptoms. They are used as needed and provide short-term relief. Using other medicines, such as: Allergy medicines, such as antihistamines, if your asthma attacks are triggered by allergens. Immune medicines (immunomodulators). These are medicines that help control the immune system. Using supplemental oxygen. This is only needed during a severe episode. Creating an asthma action plan. An asthma action plan is a written plan for managing and treating your asthma attacks. This plan includes: A list of your asthma triggers and how to avoid them. Information about when medicines should be taken and when their dosage should be changed. Instructions about using a device called a peak flow meter. A peak flow meter measures how well the lungs are working and the severity of your asthma. It helps you monitor your condition. Follow these instructions at home: Take over-the-counter and prescription medicines only as told by your health care provider. Stay up to date on all vaccinations as recommended by your healthcare provider, including vaccines for the flu and pneumonia. Use a peak flow meter and keep track of your peak flow  readings. Understand and use your asthma  action plan to address any asthma flares. Do not smoke or allow anyone to smoke in your home. Contact a health care provider if: You have wheezing, shortness of breath, or a cough that is not responding to medicines. Your medicines are causing side effects, such as a rash, itching, swelling, or trouble breathing. You need to use a reliever medicine more than 2-3 times a week. Your peak flow reading is still at 50-79% of your personal best after following your action plan for 1 hour. You have a fever and shortness of breath. Get help right away if: You are getting worse and do not respond to treatment during an asthma attack. You are short of breath when at rest or when doing very little physical activity. You have difficulty eating, drinking, or talking. You have chest pain or tightness. You develop a fast heartbeat or palpitations. You have a bluish color to your lips or fingernails. You are light-headed or dizzy, or you faint. Your peak flow reading is less than 50% of your personal best. You feel too tired to breathe normally. These symptoms may be an emergency. Get help right away. Call 911. Do not wait to see if the symptoms will go away. Do not drive yourself to the hospital. Summary Asthma is a long-term (chronic) condition that causes recurrent episodes in which the airways become tight and narrow. Asthma episodes, also called asthma attacks or asthma flares, can cause coughing, wheezing, shortness of breath, and chest pain. Asthma cannot be cured, but medicines and lifestyle changes can help keep it well controlled and prevent asthma flares. Make sure you understand how to avoid triggers and how and when to use your medicines. Asthma attacks can range from minor to life-threatening. Get help right away if you have an asthma attack and do not respond to treatment with your usual rescue medicines. This information is not intended to replace  advice given to you by your health care provider. Make sure you discuss any questions you have with your health care provider. Document Revised: 07/19/2021 Document Reviewed: 07/10/2021 Elsevier Patient Education  2024 [employer].

## 2024-08-07 NOTE — Progress Notes (Signed)
 Virtual Visit Consent   Yvette Perry, you are scheduled for a virtual visit with a [facility] provider today. Just as with appointments in the office, your consent must be obtained to participate. Your consent will be active for this visit and any virtual visit you may have with one of our providers in the next 365 days. If you have a MyChart account, a copy of this consent can be sent to you electronically.  As this is a virtual visit, video technology does not allow for your provider to perform a traditional examination. This may limit your provider's ability to fully assess your condition. If your provider identifies any concerns that need to be evaluated in person or the need to arrange testing (such as labs, EKG, etc.), we will make arrangements to do so. Although advances in technology are sophisticated, we cannot ensure that it will always work on either your end or our end. If the connection with a video visit is poor, the visit may have to be switched to a telephone visit. With either a video or telephone visit, we are not always able to ensure that we have a secure connection.  By engaging in this virtual visit, you consent to the provision of healthcare and authorize for your insurance to be billed (if applicable) for the services provided during this visit. Depending on your insurance coverage, you may receive a charge related to this service.  I need to obtain your verbal consent now. Are you willing to proceed with your visit today? Yvette Perry has provided verbal consent on 08/07/2024 for a virtual visit (video or telephone). Yvette Lamp, FNP  Date: 08/07/2024 3:33 PM   Virtual Visit via Video Note   I, Yvette Perry, connected with  Yvette Perry  ([id number], July 11, 1976) on 08/07/24 at  3:30 PM EDT by a video-enabled telemedicine application and verified that I am speaking with the correct person using two identifiers.  Location: Patient: Virtual Visit Location Patient:  Home Provider: Virtual Visit Location Provider: Home Office   I discussed the limitations of evaluation and management by telemedicine and the availability of in person appointments. The patient expressed understanding and agreed to proceed.    History of Present Illness: Yvette Perry is a 48 y.o. who identifies as a female who was assigned female at birth, and is being seen today for uri sx for a couple of days with asthma now flaring. She has combivent  to use. Prednisone  works well in these situations and she is prediabetic however it does not run glucose up. She is not in distress. No fever. Yvette Perry  HPI: HPI  Problems:  Patient Active Problem List   Diagnosis Date Noted   Primary osteoarthritis of right knee 03/02/2024   Abnormal uterine bleeding (AUB) 09/18/2022   Abscess of chest wall 02/01/2022   Cellulitis 01/31/2022   Migraine    Diabetes mellitus (HCC)    Asthma    Hypertension    Obesity, Class III, BMI 40-49.9 (morbid obesity) (HCC)    Hypokalemia    Normocytic anemia    Migraine without aura and without status migrainosus, not intractable 04/04/2021    Allergies: No Known Allergies Medications:  Current Outpatient Medications:    predniSONE  (STERAPRED UNI-PAK 21 TAB) 10 MG (21) TBPK tablet, Prednisone  10 mg- 12 day dose pack as directed- no refills., Disp: 1 each, Rfl: 0   albuterol  (PROVENTIL  HFA;VENTOLIN  HFA) 108 (90 BASE) MCG/ACT inhaler, Inhale 2 puffs into the lungs every 6 (six)  hours as needed. For shortness of breath  (Patient not taking: Reported on 01/31/2022), Disp: , Rfl:    amoxicillin -clavulanate (AUGMENTIN ) 875-125 MG tablet, Take 1 tablet by mouth 2 (two) times daily., Disp: 14 tablet, Rfl: 0   ascorbic acid (VITAMIN C) 500 MG tablet, Take 500 mg by mouth 3 (three) times daily. With rose hips, Disp: , Rfl:    B Complex-C-Biotin-D-Zinc-FA (VITAL-D RX PO), Take 1,000 mg by mouth daily., Disp: , Rfl:    butalbital -acetaminophen -caffeine  (FIORICET  WITH CODEINE)  50-325-40-30 MG capsule, Take 1 capsule by mouth every 4 (four) hours as needed for headache., Disp: , Rfl:    cholecalciferol  (VITAMIN D3) 25 MCG (1000 UNIT) tablet, Take 1 tablet (1,000 Units total) by mouth daily., Disp: 100 tablet, Rfl: 3   cholecalciferol  (VITAMIN D3) 25 MCG (1000 UT) tablet, Take 1,000 Units by mouth daily., Disp: , Rfl:    EPINEPHrine  (EPIPEN  2-PAK) 0.3 mg/0.3 mL IJ SOAJ injection, Inject 0.3 mLs (0.3 mg total) into the muscle once as needed (allergic reaction). (Patient taking differently: Inject 0.3 mg into the muscle once as needed for anaphylaxis (allergic reaction).), Disp: 2 Device, Rfl: 0   ferrous sulfate  325 (65 FE) MG tablet, Take 325 mg by mouth 3 (three) times daily with meals., Disp: , Rfl:    fexofenadine (ALLEGRA) 180 MG tablet, Take 180 mg by mouth daily., Disp: , Rfl:    fluticasone  (FLONASE ) 50 MCG/ACT nasal spray, Place 1 spray into the nose daily. , Disp: , Rfl:    fluticasone  (FLONASE ) 50 MCG/ACT nasal spray, Shake liquid and Place 2 sprays into both nostrils 2 (two) times daily., Disp: 16 g, Rfl: 5   fluticasone  (FLONASE ) 50 MCG/ACT nasal spray, Shake liquid and Place 2 sprays into both nostrils 2 (two) times daily., Disp: 16 g, Rfl: 5   fluticasone -salmeterol (ADVAIR ) 500-50 MCG/ACT AEPB, Inhale 1 puff into the lungs in the morning and at bedtime., Disp: , Rfl:    fluticasone -salmeterol (ADVAIR ) 500-50 MCG/ACT AEPB, Inhale 1 puff into the lungs 2 (two) times daily., Disp: 60 each, Rfl: 11   gabapentin  (NEURONTIN ) 300 MG capsule, Take 300 mg by mouth as needed (moderate pain)., Disp: , Rfl:    gabapentin  (NEURONTIN ) 300 MG capsule, Take 2 capsules (600 mg total) by mouth at bedtime., Disp: 60 capsule, Rfl: 2   gabapentin  (NEURONTIN ) 300 MG capsule, Take 2 capsules (600 mg total) by mouth at bedtime., Disp: 60 capsule, Rfl: 2   gabapentin  (NEURONTIN ) 400 MG capsule, Take 1 capsule (400 mg total) by mouth 2 (two) times daily in the morning and in the  afternoon., Disp: 60 capsule, Rfl: 2   gabapentin  (NEURONTIN ) 400 MG capsule, Take 2 capusles (800 mg) by mouth in the morning, then take 1 capsule (400 mg) in the afternoon., Disp: 90 capsule, Rfl: 2   ibuprofen  (ADVIL ) 600 MG tablet, Take 1 tablet (600 mg total) by mouth every 8 (eight) hours as needed for mild pain or moderate pain., Disp: 30 tablet, Rfl: 0   Ipratropium-Albuterol  (COMBIVENT  RESPIMAT) 20-100 MCG/ACT AERS respimat, Inhale 2 puffs into the lungs every 6 (six) hours as needed., Disp: , Rfl:    Ipratropium-Albuterol  (COMBIVENT  RESPIMAT) 20-100 MCG/ACT AERS respimat, Inhale 2 puffs into the lungs every 6 (six) hours as needed for wheezing, Disp: 4 g, Rfl: 11   Ipratropium-Albuterol  (COMBIVENT  RESPIMAT) 20-100 MCG/ACT AERS respimat, INHALE 2 PUFFS INTO THE LUNGS EVERY 6 HOURS AS NEEDED, Disp: 4 g, Rfl: 11   Ipratropium-Albuterol  (COMBIVENT  RESPIMAT) 20-100 MCG/ACT  AERS respimat, Inhale 2 puffs into the lungs every 6 (six) hours as needed for wheezing, Disp: 4 g, Rfl: 11   lidocaine  (LIDODERM ) 5 %, Place 1 patch onto the skin daily. Remove and discard patch within 12 hours or as directed by MD, Disp: 30 patch, Rfl: 3   lisinopril -hydrochlorothiazide  (ZESTORETIC ) 20-12.5 MG tablet, Take 2 tablets by mouth daily., Disp: 60 tablet, Rfl: 0   lisinopril -hydrochlorothiazide  (ZESTORETIC ) 20-12.5 MG tablet, Take two tablets by mouth daily., Disp: 180 tablet, Rfl: 1   lisinopril -hydrochlorothiazide  (ZESTORETIC ) 20-12.5 MG tablet, Take 2 tablets by mouth daily., Disp: 180 tablet, Rfl: 1   lisinopril -hydrochlorothiazide  (ZESTORETIC ) 20-12.5 MG tablet, Take 2 tablets by mouth daily., Disp: 180 tablet, Rfl: 1   meloxicam  (MOBIC ) 15 MG tablet, TAKE 1 TABLET BY MOUTH EVERY DAY ([facility]YS [facility]), Disp: 30 tablet, Rfl: 0   metFORMIN  (GLUCOPHAGE -XR) 500 MG 24 hr tablet, Take 1,000 mg by mouth in the morning and at bedtime., Disp: , Rfl:    metFORMIN  (GLUCOPHAGE -XR) 500 MG 24 hr tablet,  Take 2 tablets (1,000 mg total) by mouth 2 (two) times daily., Disp: 360 tablet, Rfl: 1   montelukast  (SINGULAIR ) 10 MG tablet, Take 10 mg by mouth at bedtime., Disp: , Rfl:    montelukast  (SINGULAIR ) 10 MG tablet, Take 1 tablet (10 mg total) by mouth at bedtime., Disp: 90 tablet, Rfl: 1   montelukast  (SINGULAIR ) 10 MG tablet, Take 1 tablet (10 mg total) by mouth at bedtime., Disp: 90 tablet, Rfl: 1   naproxen  (NAPROSYN ) 500 MG tablet, Take 1 tablet (500 mg total) by mouth 2 (two) times daily., Disp: 30 tablet, Rfl: 0   ondansetron  (ZOFRAN -ODT) 4 MG disintegrating tablet, Take 1-2 tablets (4-8 mg total) by mouth every 8 (eight) hours as needed., Disp: 30 tablet, Rfl: 3   pantoprazole  (PROTONIX ) 40 MG tablet, Take 1 tablet (40 mg total) by mouth in the morning., Disp: 90 tablet, Rfl: 1   pantoprazole  (PROTONIX ) 40 MG tablet, Take 1 tablet (40 mg total) by mouth in the morning., Disp: 90 tablet, Rfl: 1   prochlorperazine  (COMPAZINE ) 10 MG tablet, Take 10 mg by mouth every 6 (six) hours as needed for nausea or vomiting., Disp: , Rfl:    prochlorperazine  (COMPAZINE ) 10 MG tablet, Take 0.5 tablets (5 mg total) by mouth every 6 (six) hours as needed for nausea or headache., Disp: 15 tablet, Rfl: 1   tiZANidine  (ZANAFLEX ) 2 MG tablet, Take 1 tablet (2 mg total) by mouth every 8 (eight) hours as needed., Disp: 90 tablet, Rfl: 0  Observations/Objective: Patient is well-developed, well-nourished in no acute distress.  Resting comfortably  at home.  Head is normocephalic, atraumatic.  No labored breathing.  Speech is clear and coherent with logical content.  Patient is alert and oriented at baseline.    Assessment and Plan: 1. Persistent asthma with acute exacerbation, unspecified asthma severity (Primary)  2. Viral upper respiratory tract infection  UC as needed monitor glucose and wean off prednisone  if glucose over 250.  Follow Up Instructions: I discussed the assessment and treatment plan with  the patient. The patient was provided an opportunity to ask questions and all were answered. The patient agreed with the plan and demonstrated an understanding of the instructions.  A copy of instructions were sent to the patient via MyChart unless otherwise noted below.     The patient was advised to call back or seek an in-person evaluation if the symptoms worsen or if the condition fails  to improve as anticipated.    Mickie Badders, FNP

## 2024-08-10 ENCOUNTER — Telehealth: Admitting: Physician Assistant

## 2024-08-10 ENCOUNTER — Other Ambulatory Visit: Payer: Self-pay

## 2024-08-10 DIAGNOSIS — J019 Acute sinusitis, unspecified: Secondary | ICD-10-CM

## 2024-08-10 DIAGNOSIS — B9689 Other specified bacterial agents as the cause of diseases classified elsewhere: Secondary | ICD-10-CM | POA: Diagnosis not present

## 2024-08-10 MED ORDER — AMOXICILLIN-POT CLAVULANATE 875-125 MG PO TABS: 1.0000 | ORAL_TABLET | Freq: Two times a day (BID) | ORAL | 0 refills | Status: DC

## 2024-08-10 MED ORDER — FLUTICASONE PROPIONATE 50 MCG/ACT NA SUSP
2.0000 | Freq: Two times a day (BID) | NASAL | 5 refills | Status: AC
  Filled 2024-10-19 (×3): qty 16, 30d supply, fill #0

## 2024-08-10 NOTE — Progress Notes (Signed)

## 2024-08-17 ENCOUNTER — Encounter: Payer: Self-pay | Admitting: Radiology

## 2024-09-14 ENCOUNTER — Other Ambulatory Visit: Payer: Self-pay | Admitting: Orthopedic Surgery

## 2024-09-14 ENCOUNTER — Other Ambulatory Visit: Payer: Self-pay

## 2024-09-22 DIAGNOSIS — Z113 Encounter for screening for infections with a predominantly sexual mode of transmission: Secondary | ICD-10-CM | POA: Diagnosis not present

## 2024-09-29 ENCOUNTER — Telehealth: Admitting: Physician Assistant

## 2024-09-29 ENCOUNTER — Encounter

## 2024-09-29 DIAGNOSIS — M722 Plantar fascial fibromatosis: Secondary | ICD-10-CM | POA: Diagnosis not present

## 2024-09-29 MED ORDER — INDOMETHACIN 50 MG PO CAPS: 50.0000 mg | ORAL_CAPSULE | Freq: Three times a day (TID) | ORAL | 0 refills | Status: AC

## 2024-09-29 NOTE — Progress Notes (Signed)
 E-Visit for Plantar Fasciitis  We are sorry that you are not feeling well. We are here to help!  Based on what you shared with me it looks like you have plantar fasciitis.    I have prescribed Indomethacin  50mg  three times daily for moderate to severe pain for no more than 7 days   HOME CARE  Stretch your foot out as tolerated. Apply ice to the affected area.  May also take OTC Tylenol  along with this medicine prescribed. Do not take Meloxicam  and Indomethacin  together.  GET HELP RIGHT AWAY IF: Your symptoms persist after you have completed your treatment plan You develop severe diarrhea You develop abnormal sensations  You develop vomiting,   You develop weakness  You develop abdominal pain  FOLLOW UP WITH YOUR PRIMARY PROVIDER IF: If your symptoms do not improve within 10 days  MAKE SURE YOU  Understand these instructions. Will watch your condition. Will get help right away if you are not doing well or get worse.  Thank you for choosing an e-visit.  Your e-visit answers were reviewed by a board certified advanced clinical practitioner to complete your personal care plan. Depending upon the condition, your plan could have included both over the counter or prescription medications.  Please review your pharmacy choice. Make sure the pharmacy is open so you can pick up prescription now. If there is a problem, you may contact your provider through [employer] and have the prescription routed to another pharmacy.  Your safety is important to us . If you have drug allergies check your prescription carefully.   For the next 24 hours you can use MyChart to ask questions about today's visit, request a non-urgent call back, or ask for a work or school excuse. You will get an email in the next two days asking about your experience. I hope that your e-visit has been valuable and will speed your recovery.  I have spent 5 minutes in review of e-visit questionnaire, review and  updating patient chart, medical decision making and response to patient.   Kalisi Bevill, PA-C

## 2024-10-15 ENCOUNTER — Ambulatory Visit: Payer: Self-pay

## 2024-10-19 ENCOUNTER — Other Ambulatory Visit: Payer: Self-pay

## 2024-10-19 ENCOUNTER — Encounter: Payer: Self-pay | Admitting: Orthopedic Surgery

## 2024-10-19 ENCOUNTER — Other Ambulatory Visit (HOSPITAL_COMMUNITY): Payer: Self-pay

## 2024-10-20 ENCOUNTER — Other Ambulatory Visit (HOSPITAL_COMMUNITY): Payer: Self-pay

## 2024-10-20 ENCOUNTER — Other Ambulatory Visit: Payer: Self-pay

## 2024-10-20 ENCOUNTER — Telehealth: Admitting: Physician Assistant

## 2024-10-20 DIAGNOSIS — J019 Acute sinusitis, unspecified: Secondary | ICD-10-CM | POA: Diagnosis not present

## 2024-10-20 DIAGNOSIS — B9689 Other specified bacterial agents as the cause of diseases classified elsewhere: Secondary | ICD-10-CM | POA: Diagnosis not present

## 2024-10-20 DIAGNOSIS — M1711 Unilateral primary osteoarthritis, right knee: Secondary | ICD-10-CM

## 2024-10-20 MED ORDER — MELOXICAM 15 MG PO TABS
ORAL_TABLET | ORAL | 0 refills | Status: AC
  Filled 2024-10-20 (×2): qty 30, 30d supply, fill #0
  Filled 2024-10-20: qty 30, fill #0

## 2024-10-20 MED ORDER — MONTELUKAST SODIUM 10 MG PO TABS
10.0000 mg | ORAL_TABLET | Freq: Every day | ORAL | 1 refills | Status: AC
  Filled 2024-10-20: qty 90, 90d supply, fill #0

## 2024-10-20 MED ORDER — PREDNISONE 10 MG PO TABS
ORAL_TABLET | ORAL | 0 refills | Status: AC
  Filled 2024-10-20: qty 21, 6d supply, fill #0

## 2024-10-20 MED ORDER — PANTOPRAZOLE SODIUM 40 MG PO TBEC
40.0000 mg | DELAYED_RELEASE_TABLET | ORAL | 1 refills | Status: AC
  Filled 2024-10-20: qty 90, 90d supply, fill #0

## 2024-10-20 MED ORDER — AMOXICILLIN-POT CLAVULANATE 875-125 MG PO TABS
1.0000 | ORAL_TABLET | Freq: Two times a day (BID) | ORAL | 0 refills | Status: AC
  Filled 2024-10-20: qty 14, 7d supply, fill #0

## 2024-10-20 MED ORDER — VITAMIN D3 25 MCG (1000 UNIT) PO TABS: 25.0000 ug | ORAL_TABLET | Freq: Every day | ORAL | 3 refills | Status: AC

## 2024-10-20 NOTE — Progress Notes (Signed)
"      E-Visit for Sinus Problems  We are sorry that you are not feeling well.  Here is how we plan to help!  Based on what you have shared with me it looks like you have sinusitis.  Sinusitis is inflammation and infection in the sinus cavities of the head.  Based on your presentation I believe you most likely have Acute Bacterial Sinusitis.  This is an infection caused by bacteria and is treated with antibiotics. I have prescribed Augmentin  875mg /125mg  one tablet twice daily with food, for 7 days. You may use an oral decongestant such as Mucinex D or if you have glaucoma or high blood pressure use plain Mucinex. Saline nasal spray help and can safely be used as often as needed for congestion.    Continue your respiratory medications as directed. I have placed a short script of prednisone  on file at the pharmacy to take as directed for any worsening asthma symptoms.   If you develop worsening sinus pain, fever or notice severe headache and vision changes, or if symptoms are not better after completion of antibiotic, please schedule an appointment with a health care provider.    Sinus infections are not as easily transmitted as other respiratory infection, however we still recommend that you avoid close contact with loved ones, especially the very young and elderly.  Remember to wash your hands thoroughly throughout the day as this is the number one way to prevent the spread of infection!  Home Care: Only take medications as instructed by your medical team. Complete the entire course of an antibiotic. Do not take these medications with alcohol. A steam or ultrasonic humidifier can help congestion.  You can place a towel over your head and breathe in the steam from hot water coming from a faucet. Avoid close contacts especially the very young and the elderly. Cover your mouth when you cough or sneeze. Always remember to wash your hands.  Get Help Right Away If: You develop worsening fever or sinus  pain. You develop a severe head ache or visual changes. Your symptoms persist after you have completed your treatment plan.  Make sure you Understand these instructions. Will watch your condition. Will get help right away if you are not doing well or get worse.  Your e-visit answers were reviewed by a board certified advanced clinical practitioner to complete your personal care plan.  Depending on the condition, your plan could have included both over the counter or prescription medications.  If there is a problem please reply  once you have received a response from your provider.  Your safety is important to us .  If you have drug allergies check your prescription carefully.    You can use MyChart to ask questions about todays visit, request a non-urgent call back, or ask for a work or school excuse for 24 hours related to this e-Visit. If it has been greater than 24 hours you will need to follow up with your provider, or enter a new e-Visit to address those concerns.  You will get an e-mail in the next two days asking about your experience.  I hope that your e-visit has been valuable and will speed your recovery. Thank you for using e-visits.  I have spent 5 minutes in review of e-visit questionnaire, review and updating patient chart, medical decision making and response to patient.   Elsie Velma Lunger, PA-C     "

## 2024-10-21 ENCOUNTER — Other Ambulatory Visit (HOSPITAL_COMMUNITY): Payer: Self-pay

## 2024-10-26 ENCOUNTER — Telehealth: Admitting: Physician Assistant

## 2024-10-26 DIAGNOSIS — B379 Candidiasis, unspecified: Secondary | ICD-10-CM

## 2024-10-26 MED ORDER — FLUCONAZOLE 150 MG PO TABS: 150.0000 mg | ORAL_TABLET | ORAL | 0 refills | Status: AC | PRN

## 2024-10-26 NOTE — Progress Notes (Signed)

## 2024-10-29 ENCOUNTER — Ambulatory Visit

## 2024-10-29 ENCOUNTER — Ambulatory Visit
Admission: RE | Admit: 2024-10-29 | Discharge: 2024-10-29 | Disposition: A | Discharge status: Home / Self Care | Attending: Emergency Medicine | Admitting: Emergency Medicine

## 2024-10-29 VITALS — BP 149/92 | HR 87 | Temp 97.9°F | Resp 18

## 2024-10-29 DIAGNOSIS — M25531 Pain in right wrist: Secondary | ICD-10-CM | POA: Diagnosis not present

## 2024-10-29 NOTE — Discharge Instructions (Addendum)
 X-ray is negative.  Rest and elevate your wrist.  Apply ice packs as directed.  Take Tylenol  as needed for discomfort.  Follow-up with your orthopedist.

## 2024-10-29 NOTE — ED Triage Notes (Signed)
 Pt states she tried to move a pillow under her head and twisted her wrist and heard a pop, now having pain in her right wrist and palm.

## 2024-10-29 NOTE — ED Provider Notes (Signed)
 " Yvette Perry    CSN: [id number] Arrival date & time: 10/29/24  1814      History   Chief Complaint Chief Complaint  Patient presents with   Wrist Pain    My right wrist popped last night while trying to pull pillow from under my head.  I am having some pain and discomfort to my right wrist area and later side of my right hand. - Entered by patient    HPI Yvette Perry is a 49 y.o. female.  Patient presents with pain in her right wrist today.  The pain started when she was reached back to pull a pillow from behind her head.  She heard a pop in her wrist.  She has had pain since then.  No numbness, weakness, bruising, redness, wounds.  She takes meloxicam  for arthritis and has been supplementing this with Tylenol  today.  The history is provided by the patient and medical records.    Past Medical History:  Diagnosis Date   Abnormal Pap smear of cervix    LGSIL   Asthma    Diabetes mellitus (HCC)    Goiter    Hypertension    Migraine    Pre-diabetes    Uterine fibroid     Patient Active Problem List   Diagnosis Date Noted   Primary osteoarthritis of right knee 03/02/2024   Abnormal uterine bleeding (AUB) 09/18/2022   Abscess of chest wall 02/01/2022   Cellulitis 01/31/2022   Migraine    Diabetes mellitus (HCC)    Asthma    Hypertension    Obesity, Class III, BMI 40-49.9 (morbid obesity) (HCC)    Hypokalemia    Normocytic anemia    Migraine without aura and without status migrainosus, not intractable 04/04/2021    Past Surgical History:  Procedure Laterality Date   CESAREAN SECTION  06/2003   COLPOSCOPY  12/22/2008   normal   DILITATION & CURRETTAGE/HYSTROSCOPY WITH NOVASURE ABLATION N/A 09/18/2022   Procedure: DILATATION & CURETTAGE/HYSTEROSCOPY WITH NOVASURE ABLATION;  Surgeon: Corene Coy, MD;  Location: [facility];  Service: Gynecology;  Laterality: N/A;   ENDOMETRIAL BIOPSY  06/30/2013   benign   INCISION AND  DRAINAGE ABSCESS Right 02/02/2022   Procedure: INCISION AND DRAINAGE ABSCESS RIGHT CHEST WALL;  Surgeon: Curvin Mt III, MD;  Location: [facility];  Service: General;  Laterality: Right;  LATERAL/PT ATE AT 0930   TUBAL LIGATION  06/2003    OB History     Gravida  4   Para  4   Term  3   Preterm  1   AB      Living  4      SAB      IAB      Ectopic      Multiple  1   Live Births  4            Home Medications    Prior to Admission medications  Medication Sig Start Date End Date Taking? Authorizing Provider  gabapentin  (NEURONTIN ) 300 MG capsule Take 2 capsules (600 mg total) by mouth at bedtime. 02/13/23  Yes   Ipratropium-Albuterol  (COMBIVENT  RESPIMAT) 20-100 MCG/ACT AERS respimat Inhale 2 puffs into the lungs every 6 (six) hours as needed. 11/26/17  Yes [provider]  lisinopril -hydrochlorothiazide  (ZESTORETIC ) 20-12.5 MG tablet Take two tablets by mouth daily. 02/12/23  Yes   metFORMIN  (GLUCOPHAGE -XR) 500 MG 24 hr tablet Take 1,000 mg by mouth in the morning and at bedtime.  Yes [provider]  montelukast  (SINGULAIR ) 10 MG tablet Take 10 mg by mouth at bedtime.   Yes [provider]  pantoprazole  (PROTONIX ) 40 MG tablet Take 1 tablet (40 mg total) by mouth every morning. 10/20/24  Yes   prochlorperazine  (COMPAZINE ) 10 MG tablet Take 10 mg by mouth every 6 (six) hours as needed for nausea or vomiting.   Yes [provider]  amoxicillin -clavulanate (AUGMENTIN ) 875-125 MG tablet Take 1 tablet by mouth 2 (two) times daily. 10/20/24   Gladis Elsie BROCKS, PA-C  ascorbic acid (VITAMIN C) 500 MG tablet Take 500 mg by mouth 3 (three) times daily. With rose hips    [provider]  B Complex-C-Biotin-D-Zinc-FA (VITAL-D RX PO) Take 1,000 mg by mouth daily.    [provider]  butalbital -acetaminophen -caffeine  (FIORICET  WITH CODEINE) 50-325-40-30 MG capsule Take 1 capsule by mouth every 4 (four) hours as needed for headache.     [provider]  cholecalciferol  (VITAMIN D3) 25 MCG (1000 UNIT) tablet Take 1 tablet (25 mcg total) by mouth daily. 10/20/24     cholecalciferol  (VITAMIN D3) 25 MCG (1000 UT) tablet Take 1,000 Units by mouth daily.    [provider]  EPINEPHrine  (EPIPEN  2-PAK) 0.3 mg/0.3 mL IJ SOAJ injection Inject 0.3 mLs (0.3 mg total) into the muscle once as needed (allergic reaction). Patient taking differently: Inject 0.3 mg into the muscle once as needed for anaphylaxis (allergic reaction). 05/12/16   Carlota Day, MD  ferrous sulfate  325 (65 FE) MG tablet Take 325 mg by mouth 3 (three) times daily with meals.    [provider]  fexofenadine (ALLEGRA) 180 MG tablet Take 180 mg by mouth daily.    [provider]  fluconazole  (DIFLUCAN ) 150 MG tablet Take 1 tablet (150 mg total) by mouth every 3 (three) days as needed. 10/26/24   Vivienne Delon HERO, PA-C  fluticasone  (FLONASE ) 50 MCG/ACT nasal spray Place 1 spray into the nose daily.     [provider]  fluticasone  (FLONASE ) 50 MCG/ACT nasal spray Shake liquid and Place 2 sprays into both nostrils 2 (two) times daily. 01/09/23     fluticasone  (FLONASE ) 50 MCG/ACT nasal spray Shake liquid and Place 2 sprays into both nostrils 2 (two) times daily. 10/17/23     fluticasone  (FLONASE ) 50 MCG/ACT nasal spray Place 2 sprays into both nostrils 2 (two) times daily. 08/10/24     fluticasone -salmeterol (ADVAIR ) 500-50 MCG/ACT AEPB Inhale 1 puff into the lungs in the morning and at bedtime.    [provider]  fluticasone -salmeterol (ADVAIR ) 500-50 MCG/ACT AEPB Inhale 1 puff into the lungs 2 (two) times daily. 01/27/24     gabapentin  (NEURONTIN ) 300 MG capsule Take 300 mg by mouth as needed (moderate pain). 01/18/22   [provider]  gabapentin  (NEURONTIN ) 300 MG capsule Take 2 capsules (600 mg total) by mouth at bedtime. 05/18/24     gabapentin  (NEURONTIN ) 400 MG capsule Take 1 capsule (400 mg total) by mouth 2 (two)  times daily in the morning and in the afternoon. 06/26/24     gabapentin  (NEURONTIN ) 400 MG capsule Take 2 capusles (800 mg) by mouth in the morning, then take 1 capsule (400 mg) in the afternoon. 07/01/24     indomethacin  (INDOCIN ) 50 MG capsule Take 1 capsule (50 mg total) by mouth 3 (three) times daily with meals. 09/29/24   Gandhi, Safal, PA-C  Ipratropium-Albuterol  (COMBIVENT  RESPIMAT) 20-100 MCG/ACT AERS respimat Inhale 2 puffs into the lungs every 6 (six)  hours as needed for wheezing 02/20/23     Ipratropium-Albuterol  (COMBIVENT  RESPIMAT) 20-100 MCG/ACT AERS respimat INHALE 2 PUFFS INTO THE LUNGS EVERY 6 HOURS AS NEEDED 10/17/23     Ipratropium-Albuterol  (COMBIVENT  RESPIMAT) 20-100 MCG/ACT AERS respimat Inhale 2 puffs into the lungs every 6 (six) hours as needed for wheezing 02/26/24     lidocaine  (LIDODERM ) 5 % Place 1 patch onto the skin daily. Remove and discard patch within 12 hours or as directed by MD 07/01/24     lisinopril -hydrochlorothiazide  (ZESTORETIC ) 20-12.5 MG tablet Take 2 tablets by mouth daily. 02/05/22 09/18/22  Christobal Guadalajara, MD  lisinopril -hydrochlorothiazide  (ZESTORETIC ) 20-12.5 MG tablet Take 2 tablets by mouth daily. 08/28/23     lisinopril -hydrochlorothiazide  (ZESTORETIC ) 20-12.5 MG tablet Take 2 tablets by mouth daily. 03/30/24     meloxicam  (MOBIC ) 15 MG tablet TAKE 1 TABLET BY MOUTH EVERY DAY ([facility]) 10/20/24   Addie Cordella Hamilton, MD  metFORMIN  (GLUCOPHAGE -XR) 500 MG 24 hr tablet Take 2 tablets (1,000 mg total) by mouth 2 (two) times daily. 10/17/23     montelukast  (SINGULAIR ) 10 MG tablet Take 1 tablet (10 mg total) by mouth at bedtime. 08/28/23     montelukast  (SINGULAIR ) 10 MG tablet Take 1 tablet (10 mg total) by mouth at bedtime. 10/20/24     ondansetron  (ZOFRAN -ODT) 4 MG disintegrating tablet Take 1-2 tablets (4-8 mg total) by mouth every 8 (eight) hours as needed. 04/04/21   Ines Onetha NOVAK, MD  pantoprazole  (PROTONIX ) 40 MG tablet Take 1 tablet (40  mg total) by mouth in the morning. 05/18/24     predniSONE  (DELTASONE ) 10 MG tablet Take 6 tablets by mout on day 1, Then take 5 tablets on day 2, then 4 tablets on day 3, then 3 tablets on day 4, then 2 tablets on day 5, then 1 tablet on day 6. 10/20/24   Gladis Elsie BROCKS, PA-C  prochlorperazine  (COMPAZINE ) 10 MG tablet Take 0.5 tablets (5 mg total) by mouth every 6 (six) hours as needed for nausea or headache. 10/17/23     tiZANidine  (ZANAFLEX ) 2 MG tablet Take 1 tablet (2 mg total) by mouth every 8 (eight) hours as needed. 07/01/24       Family History Family History  Problem Relation Age of Onset   Hypertension Father    Diabetes Father    Kidney failure Father    Hypertension Mother     Social History Social History[1]   Allergies   Patient has no known allergies.   Review of Systems Review of Systems  Musculoskeletal:  Positive for arthralgias. Negative for joint swelling.  Skin:  Negative for color change, rash and wound.  Neurological:  Negative for weakness and numbness.     Physical Exam Triage Vital Signs ED Triage Vitals  Encounter Vitals Group     BP      Girls Systolic BP Percentile      Girls Diastolic BP Percentile      Boys Systolic BP Percentile      Boys Diastolic BP Percentile      Pulse      Resp      Temp      Temp src      SpO2      Weight      Height      Head Circumference      Peak Flow      Pain Score      Pain Loc      Pain  Education      Exclude from Growth Chart    No data found.  Updated Vital Signs BP (!) 149/92   Pulse 87   Temp 97.9 F (36.6 C)   Resp 18   SpO2 96%   Visual Acuity Right Eye Distance:   Left Eye Distance:   Bilateral Distance:    Right Eye Near:   Left Eye Near:    Bilateral Near:     Physical Exam Constitutional:      General: She is not in acute distress. HENT:     Mouth/Throat:     Mouth: Mucous membranes are moist.  Cardiovascular:     Rate and Rhythm: Normal rate.  Pulmonary:     Effort:  Pulmonary effort is normal. No respiratory distress.  Musculoskeletal:        General: Tenderness present. No swelling or deformity. Normal range of motion.  Skin:    General: Skin is warm and dry.     Capillary Refill: Capillary refill takes less than 2 seconds.     Findings: No bruising, erythema, lesion or rash.  Neurological:     General: No focal deficit present.     Mental Status: She is alert.     Sensory: No sensory deficit.     Motor: No weakness.      UC Treatments / Results  Labs (all labs ordered are listed, but only abnormal results are displayed) Labs Reviewed - No data to display  EKG   Radiology DG Wrist Complete Right Result Date: 10/29/2024 EXAM: 3 OR MORE VIEW(S) XRAY OF THE RIGHT WRIST 10/29/2024 06:48:29 PM COMPARISON: None available. CLINICAL HISTORY: Wrist pain, decreased ROM. FINDINGS: BONES AND JOINTS: No acute fracture or dislocation. No malalignment. SOFT TISSUES: Soft tissue swelling about the wrist. IMPRESSION: 1. No acute fracture or dislocation. Electronically signed by: Norman Gatlin MD 10/29/2024 06:51 PM EST RP Workstation: [id number]    Procedures Procedures (including critical care time)  Medications Ordered in UC Medications - No data to display  Initial Impression / Assessment and Plan / UC Course  I have reviewed the triage vital signs and the nursing notes.  Pertinent labs & imaging results that were available during my care of the patient were reviewed by me and considered in my medical decision making (see chart for details).    Right wrist pain.  X-ray negative.  Treating today with wrist splint, rest, elevation, ice packs, Tylenol .  Instructed patient to follow-up with her orthopedist.  Education provided on wrist pain.  She agrees to plan of care. Final Clinical Impressions(s) / UC Diagnoses   Final diagnoses:  Right wrist pain     Discharge Instructions      X-ray is negative.  Rest and elevate your wrist.  Apply ice  packs as directed.  Take Tylenol  as needed for discomfort.  Follow-up with your orthopedist.     ED Prescriptions   None    PDMP not reviewed this encounter.    [1]  Social History Tobacco Use   Smoking status: Never   Smokeless tobacco: Never  Vaping Use   Vaping status: Never Used  Substance Use Topics   Alcohol use: Not Currently    Comment: rarely   Drug use: No     Corlis Burnard DEL, NP 10/29/24 1901  "
